# Patient Record
Sex: Male | Born: 1970 | Hispanic: No | Marital: Single | Smoking: Never smoker
Health system: Southern US, Community
[De-identification: ages and names within clinical notes are randomized; demographics above are authoritative.]

## PROBLEM LIST (undated history)

## (undated) DIAGNOSIS — J45909 Unspecified asthma, uncomplicated: Secondary | ICD-10-CM

## (undated) DIAGNOSIS — M549 Dorsalgia, unspecified: Secondary | ICD-10-CM

## (undated) HISTORY — DX: Unspecified asthma, uncomplicated: J45.909

---

## 2007-02-05 ENCOUNTER — Emergency Department (HOSPITAL_COMMUNITY): Admission: EM | Admit: 2007-02-05 | Discharge: 2007-02-05 | Payer: Self-pay | Admitting: Emergency Medicine

## 2010-02-28 ENCOUNTER — Emergency Department (HOSPITAL_COMMUNITY)
Admission: EM | Admit: 2010-02-28 | Discharge: 2010-02-28 | Payer: Self-pay | Source: Home / Self Care | Admitting: Emergency Medicine

## 2010-03-02 LAB — URINALYSIS, ROUTINE W REFLEX MICROSCOPIC
Bilirubin Urine: NEGATIVE
Hgb urine dipstick: NEGATIVE
Ketones, ur: NEGATIVE mg/dL
Nitrite: NEGATIVE
Protein, ur: NEGATIVE mg/dL
Specific Gravity, Urine: 1.023 (ref 1.005–1.030)
Urine Glucose, Fasting: NEGATIVE mg/dL
Urobilinogen, UA: 1 mg/dL (ref 0.0–1.0)
pH: 7.5 (ref 5.0–8.0)

## 2010-03-02 LAB — URINE MICROSCOPIC-ADD ON

## 2010-03-02 LAB — GC/CHLAMYDIA PROBE AMP, GENITAL
Chlamydia, DNA Probe: NEGATIVE
GC Probe Amp, Genital: NEGATIVE

## 2010-03-03 ENCOUNTER — Emergency Department (HOSPITAL_COMMUNITY)
Admission: EM | Admit: 2010-03-03 | Discharge: 2010-03-03 | Payer: Self-pay | Source: Home / Self Care | Admitting: Emergency Medicine

## 2010-03-07 LAB — URINALYSIS, ROUTINE W REFLEX MICROSCOPIC
Bilirubin Urine: NEGATIVE
Hgb urine dipstick: NEGATIVE
Ketones, ur: NEGATIVE mg/dL
Nitrite: NEGATIVE
Protein, ur: NEGATIVE mg/dL
Specific Gravity, Urine: 1.027 (ref 1.005–1.030)
Urine Glucose, Fasting: NEGATIVE mg/dL
Urobilinogen, UA: 0.2 mg/dL (ref 0.0–1.0)
pH: 6.5 (ref 5.0–8.0)

## 2011-12-30 ENCOUNTER — Encounter (HOSPITAL_COMMUNITY): Payer: Self-pay | Admitting: *Deleted

## 2011-12-30 ENCOUNTER — Emergency Department (HOSPITAL_COMMUNITY)
Admission: EM | Admit: 2011-12-30 | Discharge: 2011-12-31 | Disposition: A | Discharge status: Home / Self Care | Payer: Self-pay | Attending: Emergency Medicine | Admitting: Emergency Medicine

## 2011-12-30 DIAGNOSIS — M549 Dorsalgia, unspecified: Secondary | ICD-10-CM | POA: Insufficient documentation

## 2011-12-30 DIAGNOSIS — M62830 Muscle spasm of back: Secondary | ICD-10-CM

## 2011-12-30 DIAGNOSIS — M5380 Other specified dorsopathies, site unspecified: Secondary | ICD-10-CM | POA: Insufficient documentation

## 2011-12-30 HISTORY — DX: Dorsalgia, unspecified: M54.9

## 2011-12-30 MED ORDER — KETOROLAC TROMETHAMINE 30 MG/ML IJ SOLN
30.0000 mg | Freq: Once | INTRAMUSCULAR | Status: AC
  Administered 2011-12-30: 30 mg via INTRAMUSCULAR
  Filled 2011-12-30: qty 1

## 2011-12-30 NOTE — ED Provider Notes (Signed)
History     CSN: [id number]  Arrival date & time 12/30/11  2252   First MD Initiated Contact with Patient 12/30/11 2324      Chief Complaint  Patient presents with  . Neck Pain  . Back Pain    (Consider location/radiation/quality/duration/timing/severity/associated sxs/prior treatment) HPI Comments: Patient states, that for 5 years ago.  He hurt his back lifting a heavy object.  It's been under control.  Since, then 2 nights ago.  He started having increased pain, prohibiting him from sleep, history over-the-counter Tylenol without any relief.  He, states he was helping someone lift a heavy package.  Patient is a 41 y.o. male presenting with neck pain and back pain. The history is provided by the patient.  Neck Pain  This is a recurrent problem. The current episode started 6 to 12 hours ago. The problem occurs constantly. The problem has not changed since onset.The pain is associated with lifting a heavy object. The quality of the pain is described as aching. The pain is at a severity of 4/10. The pain is moderate. The symptoms are aggravated by position.  Back Pain  Pertinent negatives include no fever.    Past Medical History  Diagnosis Date  . Back pain     History reviewed. No pertinent past surgical history.  History reviewed. No pertinent family history.  History  Substance Use Topics  . Smoking status: Never Smoker   . Smokeless tobacco: Not on file  . Alcohol Use: Yes     Comment: socially      Review of Systems  Constitutional: Negative for fever.  HENT: Positive for neck pain.   Gastrointestinal: Negative.   Genitourinary: Negative for frequency and flank pain.  Musculoskeletal: Positive for back pain. Negative for myalgias.  Skin: Negative for rash and wound.  Neurological: Negative for dizziness.    Allergies  Review of patient's allergies indicates no known allergies.  Home Medications   Current Outpatient Rx  Name  Route  Sig  Dispense   Refill  . IBUPROFEN 600 MG PO TABS   Oral   Take 1 tablet (600 mg total) by mouth every 6 (six) hours as needed for pain.   30 tablet   0     BP 125/73  Pulse 60  Temp 98.2 F (36.8 C) (Oral)  Resp 18  SpO2 98%  Physical Exam  Constitutional: He is oriented to person, place, and time. He appears well-developed and well-nourished.  HENT:  Head: Normocephalic.  Eyes: Pupils are equal, round, and reactive to light.  Neck: Normal range of motion.  Cardiovascular: Normal rate.   Pulmonary/Chest: Effort normal.  Musculoskeletal: Normal range of motion. He exhibits tenderness. He exhibits no edema.       Arms: Neurological: He is alert and oriented to person, place, and time.  Skin: Skin is warm.    ED Course  Procedures (including critical care time)  Labs Reviewed - No data to display No results found.   1. Muscle spasm of back       MDM   Muscle spasm        Arman Filter, NP 12/31/11 0002

## 2011-12-30 NOTE — ED Notes (Signed)
Pt states long time ago he was working and hurt his back and neck. Pt state that for the past 2 nights he has tried to lay down and his back and neck have been hurting worse. Pt ambulatory.

## 2011-12-30 NOTE — ED Notes (Signed)
Pt c/o neck and back pain, reports he lifted something heavy yesterday. Pt stated he had a previous back injury that he was hospitalized for but hasn't had any problems with his back recently until yesterday.

## 2011-12-31 MED ORDER — IBUPROFEN 600 MG PO TABS: 600.0000 mg | ORAL_TABLET | Freq: Four times a day (QID) | ORAL | Status: DC | PRN

## 2011-12-31 NOTE — Discharge Instructions (Signed)
Back Exercises Back exercises help treat and prevent back injuries. The goal is to increase your strength in your belly (abdominal) and back muscles. These exercises can also help with flexibility. Start these exercises when told by your doctor. HOME CARE Back exercises include: Pelvic Tilt.  Lie on your back with your knees bent. Tilt your pelvis until the lower part of your back is against the floor. Hold this position 5 to 10 sec. Repeat this exercise 5 to 10 times. Knee to Chest.  Pull 1 knee up against your chest and hold for 20 to 30 seconds. Repeat this with the other knee. This may be done with the other leg straight or bent, whichever feels better. Then, pull both knees up against your chest. Sit-Ups or Curl-Ups.  Bend your knees 90 degrees. Start with tilting your pelvis, and do a partial, slow sit-up. Only lift your upper half 30 to 45 degrees off the floor. Take at least 2 to 3 seonds for each sit-up. Do not do sit-ups with your knees out straight. If partial sit-ups are difficult, simply do the above but with only tightening your belly (abdominal) muscles and holding it as told. Hip-Lift.  Lie on your back with your knees flexed 90 degrees. Push down with your feet and shoulders as you raise your hips 2 inches off the floor. Hold for 10 seconds, repeat 5 to 10 times. Back Arches.  Lie on your stomach. Prop yourself up on bent elbows. Slowly press on your hands, causing an arch in your low back. Repeat 3 to 5 times. Shoulder-Lifts.  Lie face down with arms beside your body. Keep hips and belly pressed to floor as you slowly lift your head and shoulders off the floor. Do not overdo your exercises. Be careful in the beginning. Exercises may cause you some mild back discomfort. If the pain lasts for more than 15 minutes, stop the exercises until you see your doctor. Improvement with exercise for back problems is slow.  Document Released: 03/04/2010 Document Revised: 04/24/2011  Document Reviewed: 12/01/2010 [employer] Patient Information 2013 [employer].  

## 2011-12-31 NOTE — ED Provider Notes (Signed)
Medical screening examination/treatment/procedure(s) were performed by non-physician practitioner and as supervising physician I was immediately available for consultation/collaboration.  Taegen Lennox, MD 12/31/11 0750 

## 2012-01-26 ENCOUNTER — Encounter (HOSPITAL_COMMUNITY): Payer: Self-pay | Admitting: *Deleted

## 2012-01-26 ENCOUNTER — Emergency Department (HOSPITAL_COMMUNITY)
Admission: EM | Admit: 2012-01-26 | Discharge: 2012-01-27 | Disposition: A | Discharge status: Home / Self Care | Payer: Self-pay | Attending: Emergency Medicine | Admitting: Emergency Medicine

## 2012-01-26 DIAGNOSIS — T300 Burn of unspecified body region, unspecified degree: Secondary | ICD-10-CM

## 2012-01-26 DIAGNOSIS — R221 Localized swelling, mass and lump, neck: Secondary | ICD-10-CM | POA: Insufficient documentation

## 2012-01-26 DIAGNOSIS — R220 Localized swelling, mass and lump, head: Secondary | ICD-10-CM | POA: Insufficient documentation

## 2012-01-26 DIAGNOSIS — M545 Low back pain, unspecified: Secondary | ICD-10-CM | POA: Insufficient documentation

## 2012-01-26 DIAGNOSIS — X088XXA Exposure to other specified smoke, fire and flames, initial encounter: Secondary | ICD-10-CM | POA: Insufficient documentation

## 2012-01-26 DIAGNOSIS — G8929 Other chronic pain: Secondary | ICD-10-CM | POA: Insufficient documentation

## 2012-01-26 DIAGNOSIS — Y929 Unspecified place or not applicable: Secondary | ICD-10-CM | POA: Insufficient documentation

## 2012-01-26 DIAGNOSIS — Y939 Activity, unspecified: Secondary | ICD-10-CM | POA: Insufficient documentation

## 2012-01-26 DIAGNOSIS — T2016XA Burn of first degree of forehead and cheek, initial encounter: Secondary | ICD-10-CM | POA: Insufficient documentation

## 2012-01-26 NOTE — ED Notes (Signed)
The lt face is swollen for the past hour minimal pain only

## 2012-01-27 ENCOUNTER — Emergency Department (HOSPITAL_COMMUNITY): Payer: Self-pay

## 2012-01-27 MED ORDER — HYDROCODONE-ACETAMINOPHEN 5-325 MG PO TABS: 1.0000 | ORAL_TABLET | ORAL | Status: DC | PRN

## 2012-01-27 MED ORDER — IBUPROFEN 800 MG PO TABS: 800.0000 mg | ORAL_TABLET | Freq: Three times a day (TID) | ORAL | Status: DC
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
## 2012-01-27 NOTE — ED Provider Notes (Signed)
Medical screening examination/treatment/procedure(s) were performed by non-physician practitioner and as supervising physician I was immediately available for consultation/collaboration.    Thersa Mohiuddin D Autrey Human, MD 01/27/12 1633 

## 2012-02-10 ENCOUNTER — Encounter (HOSPITAL_COMMUNITY): Payer: Self-pay | Admitting: *Deleted

## 2012-02-10 ENCOUNTER — Emergency Department (HOSPITAL_COMMUNITY)
Admission: EM | Admit: 2012-02-10 | Discharge: 2012-02-10 | Disposition: A | Discharge status: Home / Self Care | Payer: Self-pay | Attending: Emergency Medicine | Admitting: Emergency Medicine

## 2012-02-10 DIAGNOSIS — Z760 Encounter for issue of repeat prescription: Secondary | ICD-10-CM | POA: Insufficient documentation

## 2012-02-10 DIAGNOSIS — M549 Dorsalgia, unspecified: Secondary | ICD-10-CM | POA: Insufficient documentation

## 2012-02-10 DIAGNOSIS — Z791 Long term (current) use of non-steroidal anti-inflammatories (NSAID): Secondary | ICD-10-CM | POA: Insufficient documentation

## 2012-02-10 DIAGNOSIS — G8929 Other chronic pain: Secondary | ICD-10-CM | POA: Insufficient documentation

## 2012-02-10 MED ORDER — HYDROCODONE-ACETAMINOPHEN 5-325 MG PO TABS
1.0000 | ORAL_TABLET | Freq: Once | ORAL | Status: AC
  Administered 2012-02-10: 1 via ORAL
  Filled 2012-02-10: qty 1

## 2012-02-10 MED ORDER — HYDROCODONE-ACETAMINOPHEN 5-325 MG PO TABS: 1.0000 | ORAL_TABLET | Freq: Four times a day (QID) | ORAL | Status: DC | PRN

## 2012-02-10 MED ORDER — IBUPROFEN 800 MG PO TABS: 800.0000 mg | ORAL_TABLET | Freq: Four times a day (QID) | ORAL | Status: DC | PRN

## 2012-02-10 MED ORDER — IBUPROFEN 400 MG PO TABS
800.0000 mg | ORAL_TABLET | Freq: Once | ORAL | Status: AC
  Administered 2012-02-10: 800 mg via ORAL
  Filled 2012-02-10: qty 2

## 2012-02-10 NOTE — ED Notes (Signed)
Pt denies any questions or pain upon discharge. 

## 2012-02-10 NOTE — ED Notes (Addendum)
Pt states he injured his back 6 years ago and has had intermittent mid back pain since.  Pt states current episode started two months ago and is typically worse in the winter.  Pt states he is unable to sleep at night due to discomfort.

## 2012-02-10 NOTE — ED Provider Notes (Signed)
History     CSN: [id number]  Arrival date & time 02/10/12  2024   First MD Initiated Contact with Patient 02/10/12 2236      Chief Complaint  Patient presents with  . Back Pain    (Consider location/radiation/quality/duration/timing/severity/associated sxs/prior treatment) HPI Comments: Here for a refill of his pain medication He has not made any attempt to establish with a PCP denies any new injury or worsening symptoms  "just out of my medicine"   Patient is a 41 y.o. male presenting with back pain. The history is provided by the patient.  Back Pain  This is a chronic problem. The problem occurs constantly. Pertinent negatives include no chest pain, no numbness, no dysuria and no weakness.    Past Medical History  Diagnosis Date  . Back pain     No past surgical history on file.  History reviewed. No pertinent family history.  History  Substance Use Topics  . Smoking status: Never Smoker   . Smokeless tobacco: Not on file  . Alcohol Use: Yes     Comment: socially      Review of Systems  Constitutional: Negative for activity change.  HENT: Negative.   Eyes: Negative.   Respiratory: Negative for shortness of breath.   Cardiovascular: Negative for chest pain.  Gastrointestinal: Negative for nausea and vomiting.  Genitourinary: Negative for dysuria, frequency and flank pain.  Musculoskeletal: Positive for back pain. Negative for gait problem.  Skin: Negative for rash and wound.  Neurological: Negative for weakness and numbness.    Allergies  Review of patient's allergies indicates no known allergies.  Home Medications   Current Outpatient Rx  Name  Route  Sig  Dispense  Refill  . HYDROCODONE-ACETAMINOPHEN 5-325 MG PO TABS   Oral   Take 1 tablet by mouth every 4 (four) hours as needed for pain.   15 tablet   0   . IBUPROFEN 800 MG PO TABS   Oral   Take 1 tablet (800 mg total) by mouth 3 (three) times daily.   12 tablet   0   .  HYDROCODONE-ACETAMINOPHEN 5-325 MG PO TABS   Oral   Take 1 tablet by mouth every 6 (six) hours as needed for pain.   20 tablet   0   . IBUPROFEN 800 MG PO TABS   Oral   Take 1 tablet (800 mg total) by mouth every 6 (six) hours as needed for pain.   30 tablet   0     BP 113/67  Pulse 82  Temp 97.6 F (36.4 C) (Oral)  Resp 18  SpO2 95%  Physical Exam  Constitutional: He is oriented to person, place, and time. He appears well-developed and well-nourished.  HENT:  Head: Normocephalic.  Eyes: Pupils are equal, round, and reactive to light.  Neck: Normal range of motion.  Cardiovascular: Normal rate.   Pulmonary/Chest: Effort normal.  Abdominal: Soft.  Musculoskeletal: Normal range of motion. He exhibits no tenderness.  Neurological: He is alert and oriented to person, place, and time.  Skin: Skin is warm.    ED Course  Procedures (including critical care time)  Labs Reviewed - No data to display No results found.   1. Chronic back pain greater than 3 months duration   2. Medication refill       MDM  Medication refill       Arman Filter, NP 02/10/12 2326

## 2012-02-17 ENCOUNTER — Ambulatory Visit: Payer: Self-pay | Admitting: Emergency Medicine

## 2012-02-17 ENCOUNTER — Ambulatory Visit: Payer: Self-pay

## 2012-02-17 VITALS — BP 132/71 | HR 81 | Temp 98.1°F | Resp 16 | Ht 67.0 in | Wt 160.0 lb

## 2012-02-17 DIAGNOSIS — N200 Calculus of kidney: Secondary | ICD-10-CM

## 2012-02-17 DIAGNOSIS — M549 Dorsalgia, unspecified: Secondary | ICD-10-CM

## 2012-02-17 MED ORDER — HYDROCODONE-ACETAMINOPHEN 5-325 MG PO TABS: 1.0000 | ORAL_TABLET | Freq: Four times a day (QID) | ORAL | Status: DC | PRN

## 2012-02-17 MED ORDER — MELOXICAM 7.5 MG PO TABS: ORAL_TABLET | ORAL | Status: DC

## 2012-02-17 NOTE — Progress Notes (Signed)
  Subjective:    Patient ID: Douglas Day, male    DOB: 1970/12/27, 42 y.o.   MRN: [id number]  HPI Pt complains of back pain. He has been having it for several months. He was seen at an urgent care for this. 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 He had xrays at [facility] for this he had x-rays of his lumbar spine. The bones appear normal however there were 2 calcific densities on the left. What appears to be in the lower pole of left kidney the other may be in the left mid ureter appear his main complaint today is in pain of his lower cervical and upper thoracic area. He is requesting pain medications to help.  Review of Systems     Objective:   Physical Exam there is tenderness over C7-T3. The tendon reflexes are 2+ and symmetrical motor strength is 5 out of 5. He has good range of motion of the upper extremities. His lungs are clear.  [facility] reading (PRIMARY) by  Dr. Cleta Alberts C-spine films are normal         Assessment & Plan:  Patient has symptoms of muscle spasm in his upper cervical spine. He will be on Mobic one a day hydrocodone at night as needed for pain. There is calcification noted on his LS-spine films he had done at the hospital that are suspicious for kidney stones. He was referred to a urologist for this.

## 2012-02-23 ENCOUNTER — Ambulatory Visit: Payer: Self-pay

## 2012-02-23 ENCOUNTER — Telehealth: Payer: Self-pay

## 2012-02-23 DIAGNOSIS — M549 Dorsalgia, unspecified: Secondary | ICD-10-CM

## 2012-02-23 NOTE — Telephone Encounter (Signed)
Spoke to patient, he is out of both meds, please advise on refills.

## 2012-02-23 NOTE — Telephone Encounter (Signed)
REQUESTING REFILLS HYDROcodone-acetaminophen (NORCO/VICODIN) 5-325 MG per tablet [address]  [phone]

## 2012-02-23 NOTE — Telephone Encounter (Signed)
PT OF [facility].  WANTS TO KNOW IF WE CAN CALL IN A REFILL ON HIS HYDROCODONE AND MOBIC FOR HIS BACK PAIN.  [phone]

## 2012-02-24 MED ORDER — HYDROCODONE-ACETAMINOPHEN 5-325 MG PO TABS: 1.0000 | ORAL_TABLET | Freq: Four times a day (QID) | ORAL | Status: DC | PRN

## 2012-02-24 NOTE — Telephone Encounter (Signed)
Rx printed.  Please fax to pharmacy.

## 2012-02-24 NOTE — Telephone Encounter (Signed)
Pts wife if calling again, pt is requesting a refill for hydrocodone Please call to advise pt states 4th call

## 2012-02-24 NOTE — Telephone Encounter (Signed)
PT'S WIFE STATES THAT PT UNABLE TO WORK DUE TO THE KIDNEY STONES, PT IS EXPERIENCING LOWER BACK PAIN, AND FATIGUE AT THIS TIME. PT'S WIFE REALLY WANTS SOMEONE TO BE ABLE TO HELP PT SO HE CAN RETURN TO WORK.  BEST# [phone]

## 2012-02-29 NOTE — ED Provider Notes (Signed)
Medical screening examination/treatment/procedure(s) were performed by non-physician practitioner and as supervising physician I was immediately available for consultation/collaboration.   Alek Borges, MD 02/29/12 0828 

## 2012-03-02 ENCOUNTER — Other Ambulatory Visit: Payer: Self-pay | Admitting: Physician Assistant

## 2012-03-02 ENCOUNTER — Telehealth: Payer: Self-pay

## 2012-03-02 NOTE — Telephone Encounter (Signed)
Refill done.  

## 2012-03-02 NOTE — Telephone Encounter (Signed)
Spoke with pt advised Rx sent to pharmacy. 

## 2012-03-02 NOTE — Telephone Encounter (Signed)
Pt  Has called several times today asking about medicaiton questions  Best number [phone]

## 2012-03-09 ENCOUNTER — Other Ambulatory Visit: Payer: Self-pay | Admitting: Physician Assistant

## 2012-03-09 NOTE — Telephone Encounter (Signed)
Patient has called a couple times asking about refill on his hydrocodone for his kidney stones. He said he would call back in 4 minutes. I did not see any messages for him since 03/02/12 but says he has called up here several times and the pharmacy and wants a refill on his medication. I seen where we we supposed to set up ref to urology so I asked him if he has appt set up with or already seen urologist and he said he was waiting for his insurance. Looked through his records here and called [facility] [address] and spoke with [place]. He was given # 20 by Dr. Hyacinth Meeker from ED 02/10/12, 02/17/12 Dr. Cleta Alberts gave # 20, 02/24/12 Chelle Jeffery refilled #20, 03/02/12 Eula Listen refilled #20. Spoke with Benny Lennert PA-C and she said he will have to come in for follow up or see urologist until we can refill his hydrocodone. Patient notified and questioning decision but asked what time we open and told him 8am. He said he would come in at 8am.

## 2012-03-10 ENCOUNTER — Emergency Department (HOSPITAL_COMMUNITY)
Admission: EM | Admit: 2012-03-10 | Discharge: 2012-03-10 | Disposition: A | Discharge status: Home / Self Care | Payer: Self-pay | Attending: Emergency Medicine | Admitting: Emergency Medicine

## 2012-03-10 ENCOUNTER — Emergency Department (HOSPITAL_COMMUNITY): Payer: Self-pay

## 2012-03-10 ENCOUNTER — Encounter (HOSPITAL_COMMUNITY): Payer: Self-pay | Admitting: Cardiology

## 2012-03-10 ENCOUNTER — Ambulatory Visit: Payer: Self-pay

## 2012-03-10 DIAGNOSIS — M549 Dorsalgia, unspecified: Secondary | ICD-10-CM | POA: Insufficient documentation

## 2012-03-10 DIAGNOSIS — N200 Calculus of kidney: Secondary | ICD-10-CM | POA: Insufficient documentation

## 2012-03-10 DIAGNOSIS — R42 Dizziness and giddiness: Secondary | ICD-10-CM | POA: Insufficient documentation

## 2012-03-10 LAB — URINALYSIS, ROUTINE W REFLEX MICROSCOPIC
Bilirubin Urine: NEGATIVE
Glucose, UA: NEGATIVE mg/dL
Hgb urine dipstick: NEGATIVE
Ketones, ur: NEGATIVE mg/dL
Leukocytes, UA: NEGATIVE
Nitrite: NEGATIVE
Protein, ur: NEGATIVE mg/dL
Specific Gravity, Urine: 1.023 (ref 1.005–1.030)
Urobilinogen, UA: 0.2 mg/dL (ref 0.0–1.0)
pH: 7 (ref 5.0–8.0)

## 2012-03-10 MED ORDER — HYDROCODONE-ACETAMINOPHEN 5-325 MG PO TABS: 1.0000 | ORAL_TABLET | ORAL | Status: DC | PRN

## 2012-03-10 MED ORDER — IBUPROFEN 400 MG PO TABS
600.0000 mg | ORAL_TABLET | Freq: Once | ORAL | Status: AC
  Administered 2012-03-10: 600 mg via ORAL
  Filled 2012-03-10: qty 2

## 2012-03-10 NOTE — ED Notes (Signed)
Received bedside report from [place], [place].  Patient currently resting quietly in bed; no respiratory or acute distress noted.  Patient states that he is ready to go home; patient informed that we are waiting on disposition from EDP.  Patient denies any other needs at this time; will continue to monitor.

## 2012-03-10 NOTE — ED Notes (Signed)
Pt reports upper back pain for the past 3 months. Denies any heavy lifting or injury to the area. States he was dx with a kidney stone 3 weeks ago, and to follow up with Urologist but is waiting for his insurance to start. Denies any n/v or urinary problems.

## 2012-03-12 NOTE — ED Provider Notes (Signed)
History     CSN: [id number]  Arrival date & time 03/10/12  1518   First MD Initiated Contact with Patient 03/10/12 1606      Chief Complaint  Patient presents with  . Back Pain    (Consider location/radiation/quality/duration/timing/severity/associated sxs/prior treatment) HPI Comments: Pt comes in with cc of back pain. Pt has hx of back pain and states he has renal stones. He ran out of his norco, and comes in requesting pain meds. He has not seen Urologist, as his insurance status is in process. The pain is intermittent, sharp, and in the right flank area. No n/v/f/c. No UTI like sx. No hx of stones that required surgical care.  Patient is a 42 y.o. male presenting with back pain. The history is provided by the patient.  Back Pain  Pertinent negatives include no chest pain, no fever, no headaches and no dysuria.    Past Medical History  Diagnosis Date  . Back pain     History reviewed. No pertinent past surgical history.  History reviewed. No pertinent family history.  History  Substance Use Topics  . Smoking status: Never Smoker   . Smokeless tobacco: Not on file  . Alcohol Use: Yes     Comment: socially      Review of Systems  Constitutional: Negative for fever, chills and activity change.  HENT: Negative for neck pain.   Eyes: Negative for visual disturbance.  Respiratory: Negative for cough, chest tightness and shortness of breath.   Cardiovascular: Negative for chest pain.  Gastrointestinal: Negative for abdominal distention.  Genitourinary: Negative for dysuria, enuresis and difficulty urinating.  Musculoskeletal: Positive for back pain. Negative for arthralgias.  Neurological: Positive for dizziness. Negative for light-headedness and headaches.  Psychiatric/Behavioral: Negative for confusion.    Allergies  Review of patient's allergies indicates no known allergies.  Home Medications   Current Outpatient Rx  Name  Route  Sig  Dispense  Refill    . HYDROCODONE-ACETAMINOPHEN 5-325 MG PO TABS   Oral   Take 1 tablet by mouth every 6 (six) hours as needed. For pain         . HYDROCODONE-ACETAMINOPHEN 5-325 MG PO TABS   Oral   Take 1 tablet by mouth every 4 (four) hours as needed for pain.   15 tablet   0     BP 119/74  Pulse 72  Temp 97.8 F (36.6 C) (Oral)  Resp 18  SpO2 99%  Physical Exam  Nursing note and vitals reviewed. Constitutional: He is oriented to person, place, and time. He appears well-developed.  HENT:  Head: Normocephalic and atraumatic.  Eyes: Conjunctivae normal and EOM are normal. Pupils are equal, round, and reactive to light.  Neck: Normal range of motion. Neck supple.  Cardiovascular: Normal rate, regular rhythm and normal heart sounds.   Pulmonary/Chest: Effort normal and breath sounds normal. No respiratory distress. He has no wheezes.  Abdominal: Soft. Bowel sounds are normal. He exhibits no distension. There is no tenderness. There is no rebound and no guarding.  Genitourinary:       No CVA tenderness  Musculoskeletal:       Pt has tenderness over the lower thoracic region No step offs, no erythema. Pt has 2+ patellar reflex bilaterally. Able to discriminate between sharp and dull. Able to ambulate  Neurological: He is alert and oriented to person, place, and time.  Skin: Skin is warm.    ED Course  Procedures (including critical care time)  Labs Reviewed  URINALYSIS, ROUTINE W REFLEX MICROSCOPIC - Abnormal; Notable for the following:    APPearance CLOUDY (*)     All other components within normal limits  LAB REPORT - SCANNED   Dg Thoracic Spine 2 View  03/10/2012  *RADIOLOGY REPORT*  Clinical Data: Chronic mid back pain, worse recently.  Temporally related to a lifting injury.  THORACIC SPINE - 2 VIEW  Comparison: No prior thoracic spine imaging.  Lumbar spine x-rays 01/27/2012.  Findings: 12 rib-bearing thoracic vertebrae with anatomic alignment.  L1 has small, rudimentary ribs,  indicating that there are six non-rib bearing lumbar vertebrae.  No fractures involving the thoracic spine.  No significant spondylosis.  Pedicles intact. Very slight upper thoracic scoliosis convex right may be positional.  IMPRESSION: No acute or significant abnormalities.  Please note that there are 12 rib-bearing thoracic vertebrae indicating that L1 has small, rudimentary ribs.  This also means that there are six non-rib bearing lumbar vertebrae.  Please adjust the numbering scheme of the prior lumbar spine examinations accordingly.   Original Report Authenticated By: Hulan Saas, M.D.      1. Back pain   2. Renal stones       MDM  Pt comes in with cc of flank and back pain. Xray shows that he had an AAS that had showed stones. His pain is intermittent, moderately severe. Thoracic spine tenderness also appreciated -and will xray to ensure there is no lytic lesions as it is quite focal. If workup negative -pt ready for discharge.  Derwood Kaplan, MD 03/12/12 0010

## 2012-03-15 ENCOUNTER — Ambulatory Visit: Payer: No Typology Code available for payment source

## 2012-03-16 ENCOUNTER — Ambulatory Visit (INDEPENDENT_AMBULATORY_CARE_PROVIDER_SITE_OTHER): Payer: No Typology Code available for payment source | Admitting: Emergency Medicine

## 2012-03-16 VITALS — BP 127/84 | HR 91 | Temp 98.0°F | Resp 17 | Ht 66.5 in | Wt 160.0 lb

## 2012-03-16 DIAGNOSIS — M549 Dorsalgia, unspecified: Secondary | ICD-10-CM | POA: Insufficient documentation

## 2012-03-16 DIAGNOSIS — N200 Calculus of kidney: Secondary | ICD-10-CM

## 2012-03-16 LAB — POCT URINALYSIS DIPSTICK
Bilirubin, UA: NEGATIVE
Blood, UA: NEGATIVE
Glucose, UA: NEGATIVE
Ketones, UA: NEGATIVE
Leukocytes, UA: NEGATIVE
Nitrite, UA: NEGATIVE
Protein, UA: 30
Spec Grav, UA: >=1.03
Urobilinogen, UA: 0.2
pH, UA: 5.5

## 2012-03-16 MED ORDER — HYDROCODONE-ACETAMINOPHEN 5-325 MG PO TABS: 1.0000 | ORAL_TABLET | ORAL | Status: DC | PRN

## 2012-03-16 MED ORDER — MELOXICAM 7.5 MG PO TABS: 7.5000 mg | ORAL_TABLET | Freq: Every day | ORAL | Status: DC

## 2012-03-16 NOTE — Progress Notes (Signed)
  Subjective:    Patient ID: Douglas Day, male    DOB: Apr 11, 1970, 42 y.o.   MRN: [id number]  HPI  42 year old male presents with low back pain. Has a history of chronic back pain.  Out of the medicine that he received in the ER and was told to be seen if pain is not better once medicine was gone.  Also told that he had a kidney stone but did not do a scan.  Had xrays of his back.  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      Review of Systems     Objective:   Physical Exam there is tenderness in the right periscapular down the right side of his back. The abdomen is soft. Straight leg raising is negative. Motor strength is 5 out of 5 all muscle groups sensory intact.  Results for orders placed in visit on 03/16/12  POCT URINALYSIS DIPSTICK      Component Value Range   Color, UA dk yellow     Clarity, UA clear     Glucose, UA neg     Bilirubin, UA neg     Ketones, UA neg     Spec Grav, UA >=1.030     Blood, UA neg     pH, UA 5.5     Protein, UA 30     Urobilinogen, UA 0.2     Nitrite, UA neg     Leukocytes, UA Negative          Assessment & Plan:  Pain medicines were prescribed with 1 refill. Since he has his insurance now if this does not resolve the issue he should go to physical therapy. The question was raised about kidney stones on a previous ER visit and he did not see the urologist due to financial issues. We'll go ahead and repeat a referral to urology so we can push this issue to rest.

## 2012-03-16 NOTE — Patient Instructions (Signed)
Thoracic Strain  You have injured the muscles or tendons that attach to the upper part of your back behind your chest. This injury is called a thoracic strain, thoracic sprain, or mid-back strain.   CAUSES   The cause of thoracic strain varies. A less severe injury involves pulling a muscle or tendon without tearing it. A more severe injury involves tearing (rupturing) a muscle or tendon. With less severe injuries, there may be little loss of strength. Sometimes, there are breaks (fractures) in the bones to which the muscles are attached. These fractures are rare, unless there was a direct hit (trauma) or you have weak bones due to osteoporosis or age. Longstanding strains may be caused by overuse or improper form during certain movements. Obesity can also increase your risk for back injuries. Sudden strains may occur due to injury or not warming up properly before exercise. Often, there is no obvious cause for a thoracic strain.  SYMPTOMS   The main symptom is pain, especially with movement, such as during exercise.  DIAGNOSIS   Your caregiver can usually tell what is wrong by taking an X-ray and doing a physical exam.  TREATMENT    Physical therapy may be helpful for recovery. Your caregiver can give you exercises to do or refer you to a physical therapist after your pain improves.   After your pain improves, strengthening and conditioning programs appropriate for your sport or occupation may be helpful.   Always warm up before physical activities or athletics. Stretching after physical activity may also help.   Certain over-the-counter medicines may also help. Ask your caregiver if there are medicines that would help you.  If this is your first thoracic strain injury, proper care and proper healing time before starting activities should prevent long-term problems. Torn ligaments and tendons require as long to heal as broken bones. Average healing times may be only 1 week for a mild strain. For torn muscles  and tendons, healing time may be up to 6 weeks to 2 months.  HOME CARE INSTRUCTIONS    Apply ice to the injured area. Ice massages may also be used as directed.   Put ice in a plastic bag.   Place a towel between your skin and the bag.   Leave the ice on for 15 to 20 minutes, 3 to 4 times a day, for the first 2 days.   Only take over-the-counter or prescription medicines for pain, discomfort, or fever as directed by your caregiver.   Keep your appointments for physical therapy if this was prescribed.   Use wraps and back braces as instructed.  SEEK IMMEDIATE MEDICAL CARE IF:    You have an increase in bruising, swelling, or pain.   Your pain has not improved with medicines.   You develop new shortness of breath, chest pain, or fever.   Problems seem to be getting worse rather than better.  MAKE SURE YOU:    Understand these instructions.   Will watch your condition.   Will get help right away if you are not doing well or get worse.  Document Released: 04/22/2003 Document Revised: 04/24/2011 Document Reviewed: 03/18/2010  [employer] Patient Information 2013 [employer].

## 2012-04-01 ENCOUNTER — Other Ambulatory Visit: Payer: Self-pay | Admitting: Emergency Medicine

## 2012-04-01 NOTE — Telephone Encounter (Signed)
PT NEEDS REFILL ON RX

## 2012-04-02 ENCOUNTER — Ambulatory Visit (INDEPENDENT_AMBULATORY_CARE_PROVIDER_SITE_OTHER): Payer: No Typology Code available for payment source | Admitting: Family Medicine

## 2012-04-02 VITALS — BP 117/70 | HR 65 | Temp 97.9°F | Resp 16 | Ht 67.0 in | Wt 163.0 lb

## 2012-04-02 DIAGNOSIS — M545 Low back pain, unspecified: Secondary | ICD-10-CM

## 2012-04-02 DIAGNOSIS — T148XXA Other injury of unspecified body region, initial encounter: Secondary | ICD-10-CM

## 2012-04-02 MED ORDER — METHOCARBAMOL 500 MG PO TABS: 500.0000 mg | ORAL_TABLET | Freq: Every evening | ORAL | Status: DC | PRN

## 2012-04-02 MED ORDER — DICLOFENAC SODIUM 75 MG PO TBEC: 75.0000 mg | DELAYED_RELEASE_TABLET | Freq: Two times a day (BID) | ORAL | Status: DC | PRN

## 2012-04-02 MED ORDER — HYDROCODONE-ACETAMINOPHEN 5-325 MG PO TABS: 1.0000 | ORAL_TABLET | Freq: Three times a day (TID) | ORAL | Status: DC | PRN

## 2012-04-02 NOTE — Progress Notes (Addendum)
[facility]:  Office Visit  Chief Complaint:  Chief Complaint  Patient presents with  . Medication Refill    hydrocodone    HPI: Douglas Day is a 42 y.o. male who complains of chronic back pain issues and wants a refill of his Lortab. He is currently not having back pain. He had back pain last night. He has more back pain when he sleeps too much, when he sits too long, when he walks too much. 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 He has been on this medicine for several months. Denies any weakness, numbness or tingling, incontinence. He has tried rx strength ibuprofen without relief. He take 20 pills of Lortab in 1 week. He states it is the only thing that works for him.  He stopped taking mobic because it did not help. He has been to multiple ERs for this recurrent back problem. " I need those pills, I am late for work, I need to work to pay the bills" he states.  He has back pain from a back pull 5-6 years ago while working at [employer], it radiates from thoracic spine, sometimes down to lumbar. It is in different places. He states he will go to another place to get his medication if we will not be prescribing his medicine. He denies being addicted to this, he understands that it is a narcotic and there are other medications for back pain but he still "wants this one" .   Past Medical History  Diagnosis Date  . Back pain    No past surgical history on file. History   Social History  . Marital Status: Single    Spouse Name: N/A    Number of Children: N/A  . Years of Education: N/A   Social History Main Topics  . Smoking status: Never Smoker   . Smokeless tobacco: Not on file  . Alcohol Use: Yes     Comment: socially  . Drug Use: No  . Sexually Active: Yes   Other Topics Concern  . Not on file   Social History Narrative  . No narrative on file   No family history on file. No Known Allergies Prior to  Admission medications   Medication Sig Start Date End Date Taking? Authorizing Provider  HYDROcodone-acetaminophen (NORCO/VICODIN) 5-325 MG per tablet Take 1 tablet by mouth every 4 (four) hours as needed for pain. Must return to clinic if not better 04/01/12  Yes Ryan M Dunn, PA-C  meloxicam (MOBIC) 7.5 MG tablet Take 1 tablet (7.5 mg total) by mouth daily. 03/16/12   Collene Gobble, MD     ROS: The patient denies fevers, chills, night sweats, unintentional weight loss, chest pain, palpitations, wheezing, dyspnea on exertion, nausea, vomiting, abdominal pain, dysuria, hematuria, melena, numbness, weakness, or tingling.   All other systems have been reviewed and were otherwise negative with the exception of those mentioned in the HPI and as above.    PHYSICAL EXAM: Filed Vitals:   04/02/12 1539  BP: 117/70  Pulse: 65  Temp: 97.9 F (36.6 C)  Resp: 16   Filed Vitals:   04/02/12 1539  Height: 5\' 7"  (1.702 m)  Weight: 163 lb (73.936 kg)   Body mass index is 25.52 kg/(m^2).  General: Alert, no acute distress HEENT:  Normocephalic, atraumatic, oropharynx patent.  Cardiovascular:  Regular rate and rhythm, no rubs murmurs or gallops.  No pedal edema.  Respiratory: Clear to auscultation bilaterally.  No wheezes, rales, or rhonchi.  No cyanosis, no use of accessory musculature GI: No organomegaly, abdomen is soft and non-tender, positive bowel sounds.  No masses. Skin: No rashes. Neurologic: Facial musculature symmetric. Psychiatric: Patient is appropriate throughout our interaction. Lymphatic: No cervical lymphadenopathy Musculoskeletal: Gait intact. Low back-nontender, full ROM, 5/5 strength, 2/2 DTRs.Sensation intact.Neg straight leg bilaterally. No saddle anesthesia   LABS: Results for orders placed in visit on 03/16/12  POCT URINALYSIS DIPSTICK      Result Value Range   Color, UA dk yellow     Clarity, UA clear     Glucose, UA neg     Bilirubin, UA neg     Ketones, UA neg      Spec Grav, UA >=1.030     Blood, UA neg     pH, UA 5.5     Protein, UA 30     Urobilinogen, UA 0.2     Nitrite, UA neg     Leukocytes, UA Negative       EKG/XRAY:   Primary read interpreted by Dr. Conley Rolls at [facility].   ASSESSMENT/PLAN: Encounter Diagnoses  Name Primary?  . Chronic low back pain Yes  . Sprain and strain    Narcotic profile pulled-he has been getting Vicodin 5/325 mg on 12/14, 12/28,1/4, 1/11, 1/18, 1/26, 2/1, 2/10 for 15-20 pills each time.  He was agitated when I recommended other meds, he states he has "tried them all" and only those pills work. Reviews of xrays for cervical, thoracic, and also lumbar spine done < 3 months ago at different facilities were negative for  Any acute fractures/dislocation. There was an incidental finding of left kidney stone.  Rx Vicodin #20 with no refills, refer to PT and also chronic pain for further management.  Rx Robaxen and Diclofenac, Vicodin We will no longer refill his Vicodin, will refer to chronic pain/PT. He understand this.  F/u prn     Babette Stum PHUONG, DO 04/02/2012 4:42 PM    04/08/12--PATIENT WAS REFERRED TO [facility]. HE STATES HE COULD NOT GO TO PT SINCE HE WORKS 3 JOBS. HE HAS BEEN REFERRED TO CHRONIC PAIN.

## 2012-04-02 NOTE — Telephone Encounter (Signed)
Pt is requesting refills on HYDROcodone-acetaminophen (NORCO/VICODIN) 5-325 MG per tablet meloxicam (MOBIC) 7.5 MG tablet   vbn  [phone]

## 2012-04-08 NOTE — Progress Notes (Deleted)
  Subjective:    Patient ID: Douglas Day, male    DOB: [birth year], 42 y.o.   MRN: [id number]  HPI    Review of Systems     Objective:   Physical Exam        Assessment & Plan:

## 2012-04-26 ENCOUNTER — Encounter (HOSPITAL_COMMUNITY): Payer: Self-pay | Admitting: Emergency Medicine

## 2012-04-26 ENCOUNTER — Emergency Department (INDEPENDENT_AMBULATORY_CARE_PROVIDER_SITE_OTHER)
Admission: EM | Admit: 2012-04-26 | Discharge: 2012-04-26 | Disposition: A | Discharge status: Home / Self Care | Payer: No Typology Code available for payment source | Source: Home / Self Care | Attending: Emergency Medicine | Admitting: Emergency Medicine

## 2012-04-26 DIAGNOSIS — M549 Dorsalgia, unspecified: Secondary | ICD-10-CM

## 2012-04-26 DIAGNOSIS — G8929 Other chronic pain: Secondary | ICD-10-CM

## 2012-04-26 NOTE — ED Provider Notes (Signed)
History     CSN: [id number]  Arrival date & time 04/26/12  1426   First MD Initiated Contact with Patient 04/26/12 1651      Chief Complaint  Patient presents with  . Medication Refill  . Back Pain    (Consider location/radiation/quality/duration/timing/severity/associated sxs/prior treatment) HPI Comments: Pt reports doesn't always have pain, it comes and goes. Also reviewed pt's previous medical records.  Is a pt of [facility], most recent note on pt indicates he will no longer be able to get vicodin rx there and was referred to PT and pain management.   Patient is a 42 y.o. male presenting with back pain. The history is provided by the patient.  Back Pain Location:  Thoracic spine and lumbar spine Quality:  Aching Radiates to:  Does not radiate Pain severity:  Moderate Onset quality:  Sudden (many years ago lifting at work) Timing:  Intermittent Progression:  Unchanged Chronicity:  Chronic Relieved by:  Narcotics Ineffective treatments:  NSAIDs and muscle relaxants Associated symptoms: no abdominal pain, no bladder incontinence, no bowel incontinence, no dysuria, no fever, no numbness, no paresthesias, no perianal numbness, no tingling and no weakness     Past Medical History  Diagnosis Date  . Back pain     History reviewed. No pertinent past surgical history.  History reviewed. No pertinent family history.  History  Substance Use Topics  . Smoking status: Never Smoker   . Smokeless tobacco: Not on file  . Alcohol Use: Yes     Comment: socially      Review of Systems  Constitutional: Negative for fever and chills.  Gastrointestinal: Negative for abdominal pain and bowel incontinence.  Genitourinary: Negative for bladder incontinence and dysuria.  Musculoskeletal: Positive for back pain.  Neurological: Negative for tingling, weakness, numbness and paresthesias.    Allergies  Review of patient's allergies indicates no known allergies.  Home Medications    Current Outpatient Rx  Name  Route  Sig  Dispense  Refill  . diclofenac (VOLTAREN) 75 MG EC tablet   Oral   Take 1 tablet (75 mg total) by mouth 2 (two) times daily as needed. Take with food for back pain, no other NSAIDs.   30 tablet   0   . HYDROcodone-acetaminophen (NORCO/VICODIN) 5-325 MG per tablet   Oral   Take 1 tablet by mouth every 8 (eight) hours as needed for pain. No more refills from our office. Will refer to chronic pain specialist.   20 tablet   0   . methocarbamol (ROBAXIN) 500 MG tablet   Oral   Take 1 tablet (500 mg total) by mouth at bedtime as needed. Muscles spasms. Do not drive with this.   30 tablet   0     BP 137/87  Pulse 66  Temp(Src) 97.8 F (36.6 C) (Oral)  Resp 21  SpO2 99%  Physical Exam  Constitutional: He is oriented to person, place, and time. He appears well-developed and well-nourished. No distress.  Musculoskeletal:       Thoracic back: Normal.       Lumbar back: Normal.  Neurological: He is alert and oriented to person, place, and time. Gait normal.    ED Course  Procedures (including critical care time)  Labs Reviewed - No data to display No results found.   1. Chronic back pain       MDM  Pt does not appear in pain today; moved from supine to sitting position using abd/back muscles fluidly, did not  have to use upper body to push up.  Explained to pt we do not provide chronic pain management here at [facility]. Encouraged pt to talk with his pcp about the pain mgmt and PT referrals.  Offered alternate med for tx (did not specify which)- pt refused saying he has other meds and none help his pain but the vicodin.         Cathlyn Parsons, NP 04/26/12 1704

## 2012-04-26 NOTE — ED Provider Notes (Signed)
Medical screening examination/treatment/procedure(s) were performed by non-physician practitioner and as supervising physician I was immediately available for consultation/collaboration.  Leslee Home, M.D.  Reuben Likes, MD 04/26/12 [phone]

## 2012-04-26 NOTE — ED Notes (Signed)
Upon discharge, pt upset and started to use profanity b/c the provider did not give him the medication he wanted and felt like he just wasted time coming here. Pt did not want to sign the discharge but eventually did.

## 2012-04-26 NOTE — ED Notes (Addendum)
Pt is here needing refill on his hydro/acet 5/325mg  for his chronic lower back pain due to an old inj Denies: dysuria, hematuria, f/v/n/d Goes to [facility] but did not want to go there today  He is alert and oriented w/no signs of acute distress.

## 2012-06-27 ENCOUNTER — Emergency Department (HOSPITAL_COMMUNITY)
Admission: EM | Admit: 2012-06-27 | Discharge: 2012-06-28 | Disposition: A | Discharge status: Home / Self Care | Payer: No Typology Code available for payment source | Attending: Emergency Medicine | Admitting: Emergency Medicine

## 2012-06-27 ENCOUNTER — Encounter (HOSPITAL_COMMUNITY): Payer: Self-pay | Admitting: *Deleted

## 2012-06-27 DIAGNOSIS — R52 Pain, unspecified: Secondary | ICD-10-CM | POA: Insufficient documentation

## 2012-06-27 DIAGNOSIS — M545 Low back pain, unspecified: Secondary | ICD-10-CM | POA: Insufficient documentation

## 2012-06-27 DIAGNOSIS — G8929 Other chronic pain: Secondary | ICD-10-CM | POA: Insufficient documentation

## 2012-06-27 NOTE — ED Notes (Signed)
C/o lower back pain when moving furniture. Sharp pain.

## 2012-06-28 MED ORDER — NAPROXEN 500 MG PO TABS: 500.0000 mg | ORAL_TABLET | Freq: Two times a day (BID) | ORAL | Status: DC

## 2012-06-28 MED ORDER — HYDROCODONE-ACETAMINOPHEN 5-325 MG PO TABS: 1.0000 | ORAL_TABLET | Freq: Four times a day (QID) | ORAL | Status: DC | PRN

## 2012-06-28 MED ORDER — CYCLOBENZAPRINE HCL 5 MG PO TABS: 5.0000 mg | ORAL_TABLET | Freq: Three times a day (TID) | ORAL | Status: DC | PRN

## 2012-06-28 MED ORDER — IBUPROFEN 400 MG PO TABS
400.0000 mg | ORAL_TABLET | Freq: Once | ORAL | Status: AC
  Administered 2012-06-28: 400 mg via ORAL
  Filled 2012-06-28: qty 1

## 2012-06-28 MED ORDER — OXYCODONE-ACETAMINOPHEN 5-325 MG PO TABS
1.0000 | ORAL_TABLET | Freq: Once | ORAL | Status: AC
  Administered 2012-06-28: 1 via ORAL
  Filled 2012-06-28: qty 1

## 2012-06-28 MED ORDER — DIAZEPAM 5 MG PO TABS
5.0000 mg | ORAL_TABLET | Freq: Once | ORAL | Status: AC
  Administered 2012-06-28: 5 mg via ORAL
  Filled 2012-06-28: qty 1

## 2012-06-28 NOTE — ED Provider Notes (Signed)
Medical screening examination/treatment/procedure(s) were performed by non-physician practitioner and as supervising physician I was immediately available for consultation/collaboration.  Olivia Mackie, MD 06/28/12 [phone]

## 2012-06-28 NOTE — ED Provider Notes (Signed)
History     CSN: [id number]  Arrival date & time 06/27/12  2221   First MD Initiated Contact with Patient 06/28/12 0044      Chief Complaint  Patient presents with  . Back Pain    (Consider location/radiation/quality/duration/timing/severity/associated sxs/prior treatment) Patient is a 42 y.o. male presenting with back pain. The history is provided by the patient and medical records.  Back Pain Location:  Sacro-iliac joint Quality:  Aching Radiates to:  Does not radiate Pain severity:  Moderate Pain is:  Unable to specify Onset quality:  Sudden Duration:  4 hours Timing:  Constant Progression:  Unchanged Chronicity:  Recurrent Context: lifting heavy objects   Context: not MVA, not occupational injury, not physical stress, not recent illness and not twisting   Context comment:  Pushing heavy couch Relieved by:  Nothing Worsened by:  Bending, ambulation and palpation Ineffective treatments:  None tried Associated symptoms: no abdominal pain, no abdominal swelling, no bladder incontinence, no bowel incontinence, no chest pain, no dysuria, no fever, no headaches, no leg pain, no numbness, no paresthesias, no pelvic pain, no perianal numbness, no tingling, no weakness and no weight loss   Risk factors: no hx of cancer, no hx of osteoporosis, no lack of exercise, no menopause, not obese, no recent surgery, no steroid use and no vascular disease     Past Medical History  Diagnosis Date  . Back pain     No past surgical history on file.  No family history on file.  History  Substance Use Topics  . Smoking status: Never Smoker   . Smokeless tobacco: Not on file  . Alcohol Use: Yes     Comment: socially      Review of Systems  Constitutional: Negative for fever and weight loss.  Cardiovascular: Negative for chest pain.  Gastrointestinal: Negative for abdominal pain and bowel incontinence.  Genitourinary: Negative for bladder incontinence, dysuria and pelvic pain.   Musculoskeletal: Positive for back pain.  Neurological: Negative for tingling, weakness, numbness, headaches and paresthesias.  All other systems reviewed and are negative.    Allergies  Review of patient's allergies indicates no known allergies.  Home Medications  No current outpatient prescriptions on file.  BP 109/60  Temp(Src) 98.3 F (36.8 C) (Oral)  Resp 14  SpO2 95%  Physical Exam  Nursing note and vitals reviewed. Constitutional: He is oriented to person, place, and time. He appears well-developed and well-nourished. No distress.  HENT:  Head: Normocephalic and atraumatic.  Eyes: Conjunctivae and EOM are normal. Pupils are equal, round, and reactive to light. No scleral icterus.  Neck: Normal range of motion and full passive range of motion without pain. Neck supple. No spinous process tenderness and no muscular tenderness present. No rigidity. Normal range of motion present. No Brudzinski's sign noted.  Cardiovascular: Normal rate, regular rhythm and intact distal pulses.  Exam reveals no gallop and no friction rub.   No murmur heard. Intact distal pulses, capillary refill less than 3 seconds bilaterally.   Pulmonary/Chest: Effort normal and breath sounds normal. No respiratory distress. He has no wheezes. He has no rales. He exhibits no tenderness.  Musculoskeletal:  Bilateral lower extremities nontender without color change, baseline range of motion of extremities with Pt has increased pain w ROM of lumbar spine. Pain w ambulation. Negative straight leg test bilaterally.   Neurological: He is alert and oriented to person, place, and time. He has normal strength and normal reflexes. No sensory deficit. Abnormal gait: no ataxia,  slowed and hunched d/t pain   Sensation at baseline for light touch in all 4 distal extremities, motor symmetric & bilateral 5/5 (hips: abduction, adduction, flexion; knee: flexion & extension; foot: dorsiflexion, plantar flexion, toes: dorsi  flexion)  Skin: Skin is warm and dry. No rash noted. He is not diaphoretic. No erythema. No pallor.  Psychiatric: He has a normal mood and affect.    ED Course  Procedures (including critical care time)  Labs Reviewed - No data to display No results found.   No diagnosis found.    MDM  Acute on chronic back pain triggered by moving couch   No neurological deficits and normal neuro exam.  Patient can walk but states is painful.  No loss of bowel or bladder control.  No concern for cauda equina.  No fever, night sweats, weight loss, h/o cancer, IVDU.  RICE protocol and pain medicine indicated and discussed with patient.          Jaci Carrel, [place] 06/28/12 [phone]

## 2012-06-28 NOTE — ED Notes (Signed)
Pt discharge.Vital signs stable and GCS 15. 

## 2012-07-14 ENCOUNTER — Emergency Department (HOSPITAL_COMMUNITY)
Admission: EM | Admit: 2012-07-14 | Discharge: 2012-07-14 | Disposition: A | Discharge status: Home / Self Care | Payer: No Typology Code available for payment source | Attending: Emergency Medicine | Admitting: Emergency Medicine

## 2012-07-14 ENCOUNTER — Encounter (HOSPITAL_COMMUNITY): Payer: Self-pay | Admitting: *Deleted

## 2012-07-14 DIAGNOSIS — Z79899 Other long term (current) drug therapy: Secondary | ICD-10-CM | POA: Insufficient documentation

## 2012-07-14 DIAGNOSIS — G8929 Other chronic pain: Secondary | ICD-10-CM | POA: Insufficient documentation

## 2012-07-14 DIAGNOSIS — M549 Dorsalgia, unspecified: Secondary | ICD-10-CM

## 2012-07-14 MED ORDER — HYDROCODONE-ACETAMINOPHEN 5-325 MG PO TABS: 1.0000 | ORAL_TABLET | Freq: Four times a day (QID) | ORAL | Status: DC | PRN

## 2012-07-14 MED ORDER — NAPROXEN 500 MG PO TABS: 500.0000 mg | ORAL_TABLET | Freq: Two times a day (BID) | ORAL | Status: DC

## 2012-07-14 NOTE — ED Provider Notes (Signed)
History    This chart was scribed for non-physician practitioner, Lottie Mussel, PA-C, working with Geoffery Lyons, MD by Melba Coon, ED Scribe. This patient was seen in room TR09C/TR09C and the patient's care was started at 10:25PM.  CSN: [id number]  Arrival date & time 07/14/12  2146   First MD Initiated Contact with Patient 07/14/12 2205      Chief Complaint  Patient presents with  . Back Pain    (Consider location/radiation/quality/duration/timing/severity/associated sxs/prior treatment) The history is provided by the patient. No language interpreter was used.   HPI Comments: Douglas Day is a 42 y.o. male who presents to the Emergency Department complaining of intermittent, moderate to severe right sided back pain without radiation, numbness or weakness to the legs with an onset last night. Douglas Day has a history of chronic back pain and takes naproxen, flexeril, and norco for the pain which usually alleviates his pain. However, he reports he ran out of medication. He has never seen a back specialist before. He reports that he exacerbated his pain when he was lifting heavy items and bending frequently at his job. He reports he works through the pain and does not rest. Bending, twisting, and overall physical movement aggravates his pain. No known allergies. No other pertinent medical symptoms.  Past Medical History  Diagnosis Date  . Back pain     No past surgical history on file.  No family history on file.  History  Substance Use Topics  . Smoking status: Never Smoker   . Smokeless tobacco: Not on file  . Alcohol Use: Yes     Comment: socially    Review of Systems  Constitutional: Negative for fever and diaphoresis.  HENT: Negative for neck pain and neck stiffness.   Eyes: Negative for visual disturbance.  Respiratory: Negative for apnea, chest tightness and shortness of breath.   Cardiovascular: Negative for chest pain and palpitations.   Gastrointestinal: Negative for nausea, vomiting, diarrhea and constipation.  Genitourinary: Negative for dysuria.  Musculoskeletal: Positive for back pain. Negative for gait problem.  Skin: Negative for rash.  Neurological: Negative for dizziness, weakness, light-headedness, numbness and headaches.  All other systems reviewed and are negative.    Allergies  Review of patient's allergies indicates no known allergies.  Home Medications   Current Outpatient Rx  Name  Route  Sig  Dispense  Refill  . cyclobenzaprine (FLEXERIL) 5 MG tablet   Oral   Take 1 tablet (5 mg total) by mouth 3 (three) times daily as needed for muscle spasms. Take 1-2 tabs TID as needed for muscle spasms   30 tablet   0   . HYDROcodone-acetaminophen (NORCO/VICODIN) 5-325 MG per tablet   Oral   Take 1 tablet by mouth every 6 (six) hours as needed for pain.   15 tablet   0   . naproxen (NAPROSYN) 500 MG tablet   Oral   Take 1 tablet (500 mg total) by mouth 2 (two) times daily.   30 tablet   0     BP 110/67  Pulse 67  Temp(Src) 97.7 F (36.5 C) (Oral)  Resp 16  Wt 153 lb 12.8 oz (69.763 kg)  BMI 24.08 kg/m2  SpO2 99%  Physical Exam  Nursing note and vitals reviewed. Constitutional: He is oriented to person, place, and time. He appears well-developed and well-nourished.  HENT:  Head: Normocephalic.  Eyes: EOM are normal. Pupils are equal, round, and reactive to light. Right eye exhibits no discharge. Left eye  exhibits no discharge.  Neck: Neck supple. No tracheal deviation present.  Cardiovascular: Normal rate and regular rhythm.   Pulmonary/Chest: Effort normal and breath sounds normal. No respiratory distress. He has no wheezes. He has no rales.  Abdominal: Soft. There is no tenderness.  Musculoskeletal: Normal range of motion. He exhibits tenderness. He exhibits no edema.  TTP right SI joint. SLR negative bilaterally. Strength intact in bilateral legs. NV intact. No midline spine tenderness.  Negative straight leg raise bilaterally.   Neurological: He is alert and oriented to person, place, and time. He has normal reflexes.  2+ bilateral patellar reflexes  Skin: Skin is warm. No rash noted. He is not diaphoretic. No erythema.    ED Course  Procedures (including critical care time)  COORDINATION OF CARE:  10:29PM - advised to f/u with PCP or back specialist. Given resource guides and referrals. He is ready for d/c.   Labs Reviewed - No data to display No results found.   1. Back pain       MDM  Pt with chronic back pain. No signs of cauda equina. No fever. No iv drug use. Pt with similar flares in the past. statse norco and naprosyn help with give prescription for few norco and naprosyn, explained must find a pcp or specialist to see for his back pain. Pt voiced understanding.   Filed Vitals:   07/14/12 2152 07/14/12 2200  BP: 110/67   Pulse: 67   Temp: 97.7 F (36.5 C)   TempSrc: Oral   Resp: 16   Weight:  153 lb 12.8 oz (69.763 kg)  SpO2: 99%    I personally performed the services described in this documentation, which was scribed in my presence. The recorded information has been reviewed and is accurate.        Lottie Mussel, PA-C 07/15/12 [phone]

## 2012-07-14 NOTE — ED Notes (Signed)
Pt with recurrent rt sided back pain. Has ran out of medication. Last was at job lifting and bending. Exacerbated his pain.

## 2012-07-15 NOTE — ED Provider Notes (Signed)
Medical screening examination/treatment/procedure(s) were performed by non-physician practitioner and as supervising physician I was immediately available for consultation/collaboration.  John-Adam Aubery Douthat, M.D.   John-Adam Irelynn Schermerhorn, MD 07/15/12 0209 

## 2012-08-30 ENCOUNTER — Encounter (HOSPITAL_COMMUNITY): Payer: Self-pay | Admitting: *Deleted

## 2012-08-30 ENCOUNTER — Emergency Department (HOSPITAL_COMMUNITY)
Admission: EM | Admit: 2012-08-30 | Discharge: 2012-08-30 | Disposition: A | Discharge status: Home / Self Care | Payer: No Typology Code available for payment source | Attending: Emergency Medicine | Admitting: Emergency Medicine

## 2012-08-30 DIAGNOSIS — G8929 Other chronic pain: Secondary | ICD-10-CM | POA: Insufficient documentation

## 2012-08-30 DIAGNOSIS — M549 Dorsalgia, unspecified: Secondary | ICD-10-CM

## 2012-08-30 MED ORDER — NAPROXEN 500 MG PO TABS: 500.0000 mg | ORAL_TABLET | Freq: Two times a day (BID) | ORAL | Status: DC

## 2012-08-30 NOTE — ED Provider Notes (Signed)
This chart was scribed for Douglas Day, a non-physician practitioner working with Doug Sou, MD by Lewanda Rife, ED Scribe. This patient was seen in room TR11C/TR11C and the patient's care was started at 1800.     History    CSN: [id number] Arrival date & time 08/30/12  1658  First MD Initiated Contact with Patient 08/30/12 1735     Chief Complaint  Patient presents with  . Medication Refill   (Consider location/radiation/quality/duration/timing/severity/associated sxs/prior Treatment) The history is provided by the patient.   HPI Comments: Douglas Day is a 42 y.o. male who presents to the Emergency Department complaining of constant chronic right-sided low back pain onset 1 year. Denies associated recent injury, paraesthesias, and fever. Report ambulates well without assistance. Reports low back pain is unchanged, but needs pain medication refill. Denies aggravating or alleviating symptoms. Denies taking other medications or heating pad to alleviate symptoms. Denies trying to find another doctor.     Past Medical History  Diagnosis Date  . Back pain    History reviewed. No pertinent past surgical history. History reviewed. No pertinent family history. History  Substance Use Topics  . Smoking status: Never Smoker   . Smokeless tobacco: Not on file  . Alcohol Use: Yes     Comment: socially    Review of Systems  Constitutional: Negative for fever.  Musculoskeletal: Positive for back pain.  Psychiatric/Behavioral: Negative for confusion.  All other systems reviewed and are negative.   A complete 10 system review of systems was obtained and all systems are negative except as noted in the HPI and PMH.    Allergies  Review of patient's allergies indicates no known allergies.  Home Medications  No current outpatient prescriptions on file. BP 115/74  Pulse 70  Temp(Src) 99 F (37.2 C) (Oral)  Resp 18  SpO2 98% Physical Exam  Nursing note and  vitals reviewed. Constitutional: He is oriented to person, place, and time. He appears well-developed and well-nourished. No distress.  HENT:  Head: Normocephalic and atraumatic.  Eyes: EOM are normal.  Neck: Neck supple. No tracheal deviation present.  Cardiovascular: Normal rate.   Pulmonary/Chest: Effort normal. No respiratory distress.  Musculoskeletal: Normal range of motion.  Midline lumbar spine tenderness, no perivertebral tenderness   Neurological: He is alert and oriented to person, place, and time.  5/5 and equal lower extremity strength. 2+ and equal patellar reflexes bilaterally. Pt able to dorsiflex bilateral toes and feet with good strength against resistance. Equal sensation bilaterally over thighs and lower legs.   Skin: Skin is warm and dry.  Psychiatric: He has a normal mood and affect. His behavior is normal.   ED Course  Procedures (including critical care time)  6:00 PM pt offered pain medicine in ED, but refused because he is fasting  1. Chronic back pain     MDM  Pt with chronic back pain. No new injuries. No new findings on exam. Pt asking for norco refill. Multiple prior visits for the same. Will not prescribe narcotics at this time. Will d/c home with follow up with pcp.   Filed Vitals:   08/30/12 1714  BP: 115/74  Pulse: 70  Temp: 99 F (37.2 C)  TempSrc: Oral  Resp: 18  SpO2: 98%   I personally performed the services described in this documentation, which was scribed in my presence. The recorded information has been reviewed and is accurate.   Lottie Mussel, PA-C 08/30/12 2353

## 2012-08-30 NOTE — ED Notes (Signed)
Reports right side lower back pain, hx of same but is out of his meds. Ambulatory at triage.

## 2012-08-31 NOTE — ED Provider Notes (Signed)
Medical screening examination/treatment/procedure(s) were performed by non-physician practitioner and as supervising physician I was immediately available for consultation/collaboration.  Doug Sou, MD 08/31/12 [phone]

## 2012-12-22 ENCOUNTER — Emergency Department (HOSPITAL_COMMUNITY)
Admission: EM | Admit: 2012-12-22 | Discharge: 2012-12-23 | Disposition: A | Discharge status: Home / Self Care | Payer: No Typology Code available for payment source | Attending: Emergency Medicine | Admitting: Emergency Medicine

## 2012-12-22 ENCOUNTER — Encounter (HOSPITAL_COMMUNITY): Payer: Self-pay | Admitting: Emergency Medicine

## 2012-12-22 DIAGNOSIS — M549 Dorsalgia, unspecified: Secondary | ICD-10-CM | POA: Insufficient documentation

## 2012-12-22 DIAGNOSIS — M25519 Pain in unspecified shoulder: Secondary | ICD-10-CM | POA: Insufficient documentation

## 2012-12-22 DIAGNOSIS — Z79899 Other long term (current) drug therapy: Secondary | ICD-10-CM | POA: Insufficient documentation

## 2012-12-22 DIAGNOSIS — G8929 Other chronic pain: Secondary | ICD-10-CM | POA: Insufficient documentation

## 2012-12-22 DIAGNOSIS — M25579 Pain in unspecified ankle and joints of unspecified foot: Secondary | ICD-10-CM | POA: Insufficient documentation

## 2012-12-22 DIAGNOSIS — M79672 Pain in left foot: Secondary | ICD-10-CM

## 2012-12-22 DIAGNOSIS — R51 Headache: Secondary | ICD-10-CM | POA: Insufficient documentation

## 2012-12-22 NOTE — ED Notes (Signed)
Pt. 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

## 2012-12-23 MED ORDER — NAPROXEN 500 MG PO TABS: 500.0000 mg | ORAL_TABLET | Freq: Two times a day (BID) | ORAL | Status: DC

## 2012-12-23 MED ORDER — NAPROXEN 250 MG PO TABS
500.0000 mg | ORAL_TABLET | Freq: Once | ORAL | Status: AC
  Administered 2012-12-23: 500 mg via ORAL
  Filled 2012-12-23: qty 2

## 2012-12-23 NOTE — ED Provider Notes (Signed)
Medical screening examination/treatment/procedure(s) were performed by non-physician practitioner and as supervising physician I was immediately available for consultation/collaboration.    Vida Roller, MD 12/23/12 [phone]

## 2012-12-23 NOTE — ED Provider Notes (Signed)
CSN: [id number]     Arrival date & time 12/22/12  2320 History   First MD Initiated Contact with Patient 12/22/12 2354     Chief Complaint  Patient presents with  . Foot Pain  . Back Pain   (Consider location/radiation/quality/duration/timing/severity/associated sxs/prior Treatment) HPI Comments: Patient with a history of chronic back pain out of his Naprosyn.  States, that he slept in a funny position, and his head, left shoulder and neck pain.  All day.  Not taking any medication.  Prior to arrival.  Symptoms or not any different than his normal chronic back pain He also complains that the bottom of his left foot, lateral aspect, mid foot, has a sharp pain today.  Denies stepping on any foreign bodies, hyperextending his foot, or trauma, in any way  Patient is a 42 y.o. male presenting with lower extremity pain and back pain. The history is provided by the patient.  Foot Pain This is a chronic problem. The problem occurs constantly. Associated symptoms include neck pain. Pertinent negatives include no fever, headaches, joint swelling, numbness or weakness. The symptoms are aggravated by exertion. He has tried nothing for the symptoms. The treatment provided no relief.  Back Pain Associated symptoms: no fever, no headaches, no numbness and no weakness     Past Medical History  Diagnosis Date  . Back pain    History reviewed. No pertinent past surgical history. No family history on file. History  Substance Use Topics  . Smoking status: Never Smoker   . Smokeless tobacco: Not on file  . Alcohol Use: Yes     Comment: socially    Review of Systems  Constitutional: Negative for fever.  HENT: Negative for ear pain.   Musculoskeletal: Positive for back pain and neck pain. Negative for joint swelling.  Skin: Negative for wound.  Neurological: Negative for dizziness, weakness, numbness and headaches.  All other systems reviewed and are negative.    Allergies  Review of patient's  allergies indicates no known allergies.  Home Medications   Current Outpatient Rx  Name  Route  Sig  Dispense  Refill  . naproxen (NAPROSYN) 500 MG tablet   Oral   Take 1 tablet (500 mg total) by mouth 2 (two) times daily.   30 tablet   0   . naproxen (NAPROSYN) 500 MG tablet   Oral   Take 1 tablet (500 mg total) by mouth 2 (two) times daily with a meal.   60 tablet   0    BP 124/65  Pulse 75  Temp(Src) 97.5 F (36.4 C) (Oral)  Resp 18  Wt 153 lb 8 oz (69.627 kg)  SpO2 98% Physical Exam  Nursing note and vitals reviewed. Constitutional: He is oriented to person, place, and time. He appears well-developed and well-nourished.  HENT:  Head: Normocephalic.  Right Ear: External ear normal.  Left Ear: External ear normal.  Eyes: Pupils are equal, round, and reactive to light.  Neck: Normal range of motion.  Cardiovascular: Normal rate.   Pulmonary/Chest: Effort normal.  Musculoskeletal: Normal range of motion. He exhibits tenderness. He exhibits no edema.  Neurological: He is alert and oriented to person, place, and time.  Skin: Skin is warm. No rash noted. No erythema.    ED Course  Procedures (including critical care time) Labs Review Labs Reviewed - No data to display Imaging Review No results found.  EKG Interpretation   None       MDM   1. Chronic back  pain greater than 3 months duration   2. Foot pain, left     We'll treat patient's discomfort with his normal.  Naprosyn    Arman Filter, NP 12/23/12 [phone]

## 2013-11-10 IMAGING — CR DG CERVICAL SPINE 2 OR 3 VIEWS
2 series · 2 of 2 positions shown · non-contrast
Comparison: None.

CLINICAL DATA: Chronic neck pain

CERVICAL SPINE - 2-3 VIEW

[lateral]
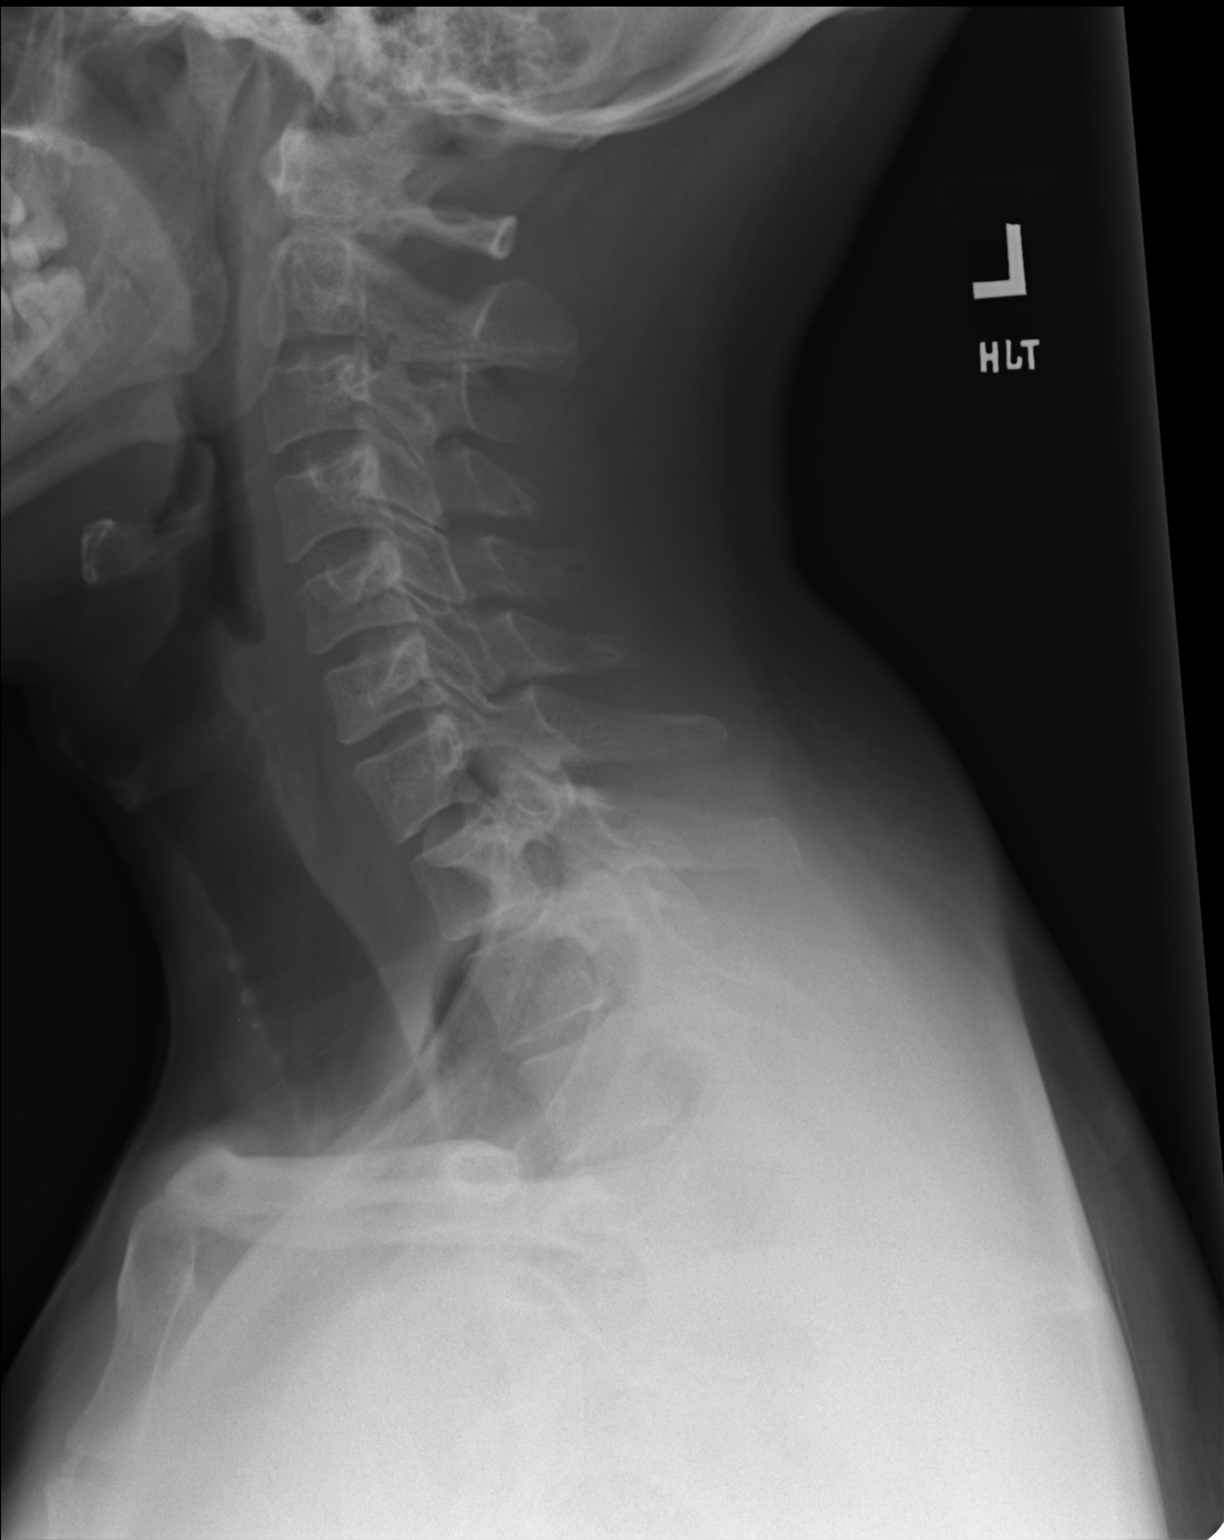

[AP]
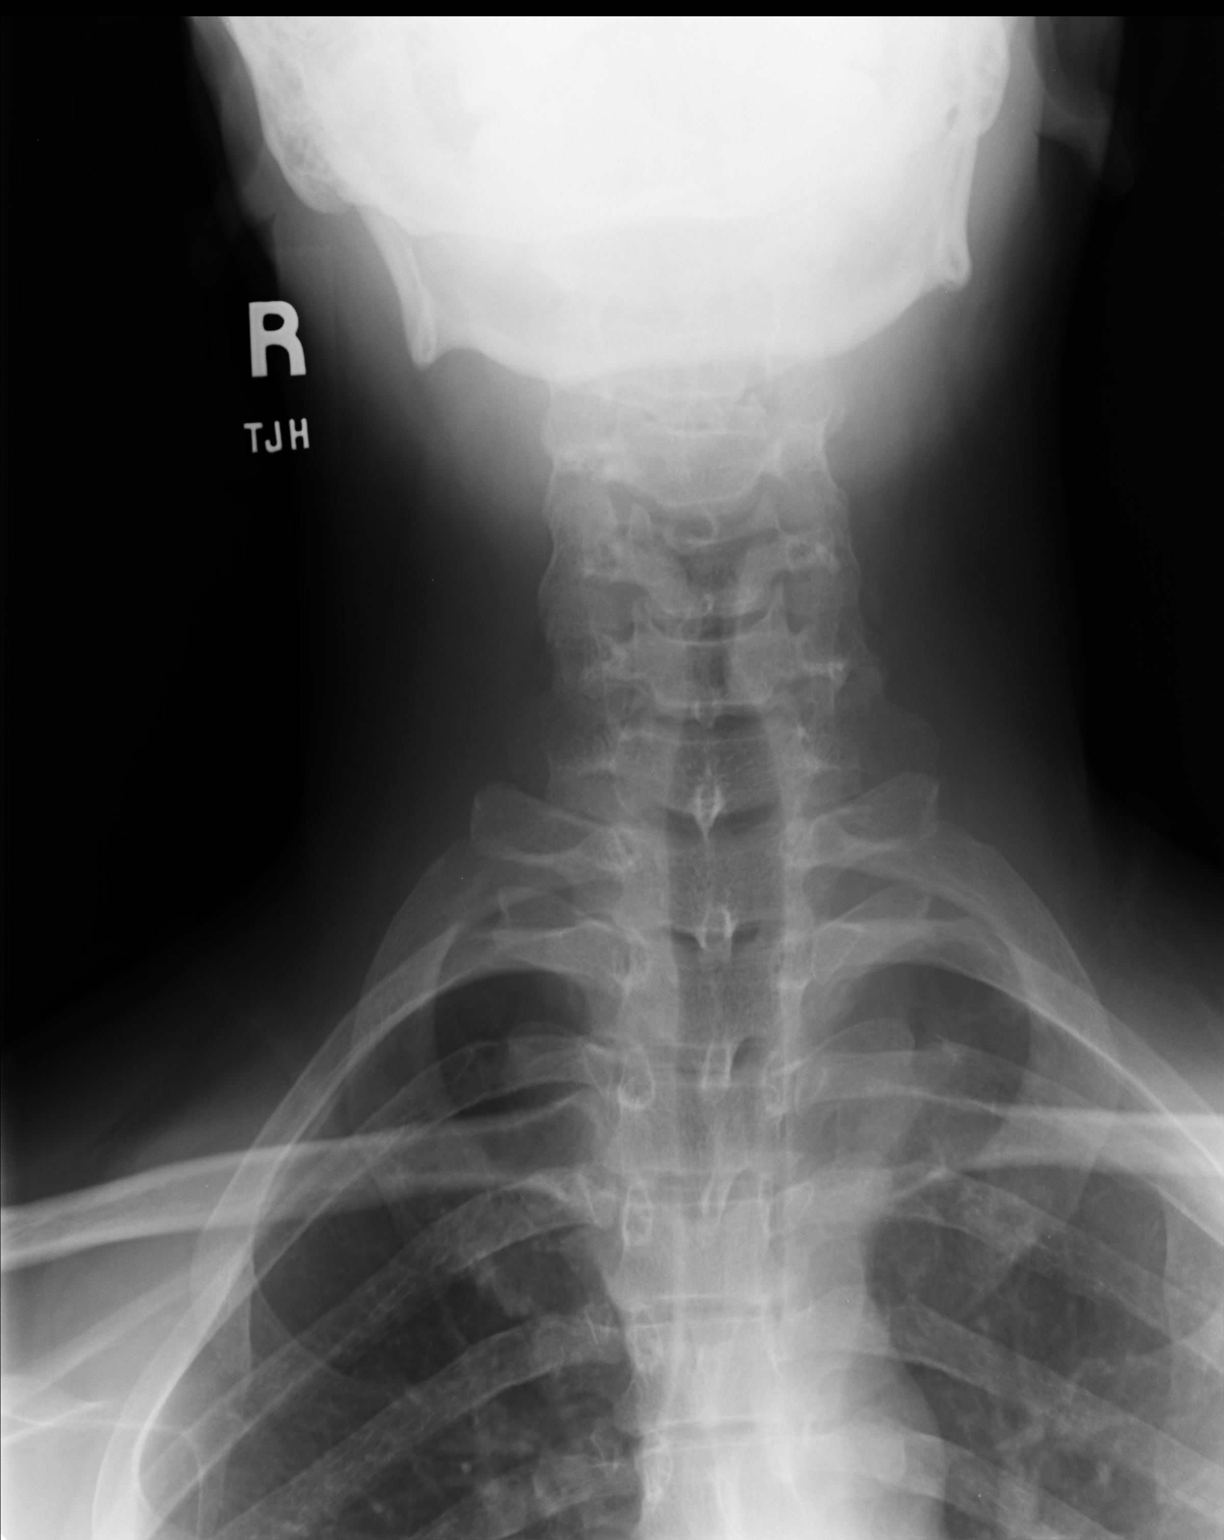

[2 of 2 positions shown; findings below may reference images not displayed]

FINDINGS: No fracture or spondylolisthesis.  Mild loss of disc
height is noted at C5-C6 and C6-C7.  No other degenerative change.
Normal soft tissues.
IMPRESSION: Minor degenerative changes.  Exam otherwise unremarkable.

## 2017-05-24 ENCOUNTER — Other Ambulatory Visit: Payer: Self-pay

## 2017-05-24 ENCOUNTER — Encounter (HOSPITAL_COMMUNITY): Payer: Self-pay | Admitting: Emergency Medicine

## 2017-05-24 ENCOUNTER — Ambulatory Visit (HOSPITAL_COMMUNITY)
Admission: EM | Admit: 2017-05-24 | Discharge: 2017-05-24 | Disposition: A | Discharge status: Home / Self Care | Payer: No Typology Code available for payment source | Attending: Internal Medicine | Admitting: Internal Medicine

## 2017-05-24 DIAGNOSIS — M25512 Pain in left shoulder: Secondary | ICD-10-CM

## 2017-05-24 MED ORDER — MELOXICAM 7.5 MG PO TABS: 7.5000 mg | ORAL_TABLET | Freq: Every day | ORAL | 0 refills | Status: DC

## 2017-05-24 MED ORDER — CYCLOBENZAPRINE HCL 5 MG PO TABS: 5.0000 mg | ORAL_TABLET | Freq: Every evening | ORAL | 0 refills | Status: DC | PRN

## 2017-05-24 NOTE — Discharge Instructions (Signed)
Start Mobic as directed. Flexeril as needed at night. Flexeril can make you drowsy, so do not take if you are going to drive, operate heavy machinery, or make important decisions. Ice/heat compresses as needed. This can take up to 3-4 weeks to completely resolve, but you should be feeling better each week. Follow up here or with PCP if symptoms worsen, changes for reevaluation.

## 2017-05-24 NOTE — ED Provider Notes (Signed)
[facility]    CSN: [id number] Arrival date & time: 05/24/17  1635     History   Chief Complaint Chief Complaint  Patient presents with  . Shoulder Pain    HPI Douglas Day is a 47 y.o. male.   47 year old male comes in for 1 week history of left shoulder pain.  States was carrying heavy equipment at work, which caused the pain to start the next morning.  No known injury/trauma.  States pain is around the left shoulder blades that goes to the shoulder.  He has full range of motion.  Denies numbness, tingling.  Has tried topical medicine and massage without relief.     Past Medical History:  Diagnosis Date  . Back pain     Patient Active Problem List   Diagnosis Date Noted  . Back pain 03/16/2012    History reviewed. No pertinent surgical history.     Home Medications    Prior to Admission medications   Medication Sig Start Date End Date Taking? Authorizing Provider  cyclobenzaprine (FLEXERIL) 5 MG tablet Take 1 tablet (5 mg total) by mouth at bedtime as needed for muscle spasms. 05/24/17   Cathie HoopsYu, Amy V, PA-C  meloxicam (MOBIC) 7.5 MG tablet Take 1 tablet (7.5 mg total) by mouth daily. 05/24/17   Belinda FisherYu, Amy V, PA-C    Family History Family History  Problem Relation Age of Onset  . Healthy Father     Social History Social History   Tobacco Use  . Smoking status: Never Smoker  Substance Use Topics  . Alcohol use: Yes    Comment: socially  . Drug use: No     Allergies   Patient has no known allergies.   Review of Systems Review of Systems  Reason unable to perform ROS: See HPI as above.     Physical Exam Triage Vital Signs ED Triage Vitals  Enc Vitals Group     BP 05/24/17 1743 126/68     Pulse Rate 05/24/17 1743 64     Resp 05/24/17 1743 18     Temp 05/24/17 1743 98.1 F (36.7 C)     Temp Source 05/24/17 1743 Oral     SpO2 05/24/17 1743 100 %     Weight --      Height --      Head Circumference --      Peak Flow --    Pain Score 05/24/17 1741 10     Pain Loc --      Pain Edu? --      Excl. in GC? --    No data found.  Updated Vital Signs BP 126/68 (BP Location: Right Arm)   Pulse 64   Temp 98.1 F (36.7 C) (Oral)   Resp 18   SpO2 100%   Physical Exam  Constitutional: He is oriented to person, place, and time. He appears well-developed and well-nourished. No distress.  HENT:  Head: Normocephalic and atraumatic.  Eyes: Pupils are equal, round, and reactive to light. Conjunctivae are normal.  Cardiovascular: Normal rate, regular rhythm and normal heart sounds. Exam reveals no gallop and no friction rub.  No murmur heard. Pulmonary/Chest: Effort normal and breath sounds normal. He has no wheezes. He has no rales.  Musculoskeletal:  No tenderness on palpation of the spinous processes.  Diffuse tenderness to palpation of left back, parascapular/trapezius muscle.  No bony tenderness. Full range of motion of shoulder. Strength normal and equal bilaterally. Sensation intact and equal bilaterally.  Radial pulses 2+ and equal bilaterally. Capillary refill less than 2 seconds.   Neurological: He is alert and oriented to person, place, and time.  Skin: Skin is warm and dry.     UC Treatments / Results  Labs (all labs ordered are listed, but only abnormal results are displayed) Labs Reviewed - No data to display  EKG None Radiology No results found.  Procedures Procedures (including critical care time)  Medications Ordered in UC Medications - No data to display   Initial Impression / Assessment and Plan / UC Course  I have reviewed the triage vital signs and the nursing notes.  Pertinent labs & imaging results that were available during my care of the patient were reviewed by me and considered in my medical decision making (see chart for details).    Start NSAID as directed for pain and inflammation. Muscle relaxant as needed. Ice/heat compresses. Discussed with patient strain can take up  to 3-4 weeks to resolve, but should be getting better each week. Return precautions given.    Final Clinical Impressions(s) / UC Diagnoses   Final diagnoses:  Acute pain of left shoulder    ED Discharge Orders        Ordered    meloxicam (MOBIC) 7.5 MG tablet  Daily     05/24/17 1902    cyclobenzaprine (FLEXERIL) 5 MG tablet  At bedtime PRN     05/24/17 1902        Belinda Fisher, PA-C 05/24/17 1905

## 2017-05-24 NOTE — ED Triage Notes (Signed)
Reports carrying a lot of heavy equipment and soreness followed.  No known injury.  Pain is left shoulder.  States pain keeps him awake at night

## 2017-08-31 ENCOUNTER — Ambulatory Visit (HOSPITAL_COMMUNITY)
Admission: EM | Admit: 2017-08-31 | Discharge: 2017-08-31 | Disposition: A | Discharge status: Home / Self Care | Payer: Self-pay | Attending: Internal Medicine | Admitting: Internal Medicine

## 2017-08-31 ENCOUNTER — Encounter (HOSPITAL_COMMUNITY): Payer: Self-pay

## 2017-08-31 DIAGNOSIS — L237 Allergic contact dermatitis due to plants, except food: Secondary | ICD-10-CM

## 2017-08-31 MED ORDER — MUPIROCIN 2 % EX OINT: 1.0000 "application " | TOPICAL_OINTMENT | Freq: Two times a day (BID) | CUTANEOUS | 0 refills | Status: DC

## 2017-08-31 MED ORDER — PREDNISONE 50 MG PO TABS: 50.0000 mg | ORAL_TABLET | Freq: Every day | ORAL | 0 refills | Status: AC

## 2017-08-31 NOTE — Discharge Instructions (Signed)
Keep area clean and dry.  You can apply Bactroban ointment where we drained the fluid.  Start prednisone for poison ivy rash.  Do not itch area.  You can apply ice if needed.  Follow-up for reevaluation if experiencing worsening symptoms such as spreading redness, increased warmth, fever.

## 2017-08-31 NOTE — ED Triage Notes (Signed)
Pt presents with complaints of worsening poison ivy

## 2017-08-31 NOTE — ED Provider Notes (Signed)
[facility]    CSN: [id number] Arrival date & time: 08/31/17  1947     History   Chief Complaint Chief Complaint  Patient presents with  . Poison Ivy    HPI Douglas Day is a 47 y.o. male.   47 year old male comes in for 2-week history of poison ivy rash that has not resolved.  States has been applying calamine lotion with mild relief.  He has to bolus on the lateral left foot that is causing increased pain and discomfort as he cannot put on his shoes.  Still has some itching to the rash on the forearms.  States rashes bilateral feet, and bilateral forearms.  States sustained this while cleaning the front yard, and realized that there was poison ivy.  Denies fever, chills, night sweats.  Denies spreading erythema, increased warmth, fever.     Past Medical History:  Diagnosis Date  . Back pain     Patient Active Problem List   Diagnosis Date Noted  . Back pain 03/16/2012    History reviewed. No pertinent surgical history.     Home Medications    Prior to Admission medications   Medication Sig Start Date End Date Taking? Authorizing Provider  cyclobenzaprine (FLEXERIL) 5 MG tablet Take 1 tablet (5 mg total) by mouth at bedtime as needed for muscle spasms. 05/24/17   Cathie HoopsYu, Iliya Spivack V, PA-C  meloxicam (MOBIC) 7.5 MG tablet Take 1 tablet (7.5 mg total) by mouth daily. 05/24/17   Cathie HoopsYu, Jolita Haefner V, PA-C  mupirocin ointment (BACTROBAN) 2 % Apply 1 application topically 2 (two) times daily. 08/31/17   Cathie HoopsYu, Brolin Dambrosia V, PA-C  predniSONE (DELTASONE) 50 MG tablet Take 1 tablet (50 mg total) by mouth daily for 5 days. 08/31/17 09/05/17  Belinda FisherYu, Armie Moren V, PA-C    Family History Family History  Problem Relation Age of Onset  . Healthy Father     Social History Social History   Tobacco Use  . Smoking status: Never Smoker  Substance Use Topics  . Alcohol use: Yes    Comment: socially  . Drug use: No     Allergies   Patient has no known allergies.   Review of Systems Review of  Systems  Reason unable to perform ROS: See HPI as above.     Physical Exam Triage Vital Signs ED Triage Vitals [08/31/17 2013]  Enc Vitals Group     BP 126/75     Pulse Rate 60     Resp 18     Temp 98.4 F (36.9 C)     Temp src      SpO2 100 %     Weight      Height      Head Circumference      Peak Flow      Pain Score 0     Pain Loc      Pain Edu?      Excl. in GC?    No data found.  Updated Vital Signs BP 126/75   Pulse 60   Temp 98.4 F (36.9 C)   Resp 18   SpO2 100%   Physical Exam  Constitutional: He is oriented to person, place, and time. He appears well-developed and well-nourished. No distress.  HENT:  Head: Normocephalic and atraumatic.  Eyes: Pupils are equal, round, and reactive to light. Conjunctivae are normal.  Neurological: He is alert and oriented to person, place, and time.  Skin: He is not diaphoretic.  Vesicular rash to the forearms  and bilateral feet.  Has to bolus on the lateral left foot.  One measuring 3 cm x 1 cm.  The other measuring 0.5 cm x 0.5 cm.     UC Treatments / Results  Labs (all labs ordered are listed, but only abnormal results are displayed) Labs Reviewed - No data to display  EKG None  Radiology No results found.  Procedures Incision and Drainage Date/Time: 08/31/2017 9:28 PM Performed by: Belinda Fisher, PA-C Authorized by: Isa Rankin, MD   Consent:    Consent obtained:  Verbal   Consent given by:  Patient   Risks discussed:  Bleeding, incomplete drainage, infection and pain   Alternatives discussed:  Alternative treatment Location:    Type:  Bulla   Size:  1cm x 3cm, 0.5cm x 0.5cm   Location:  Lower extremity   Lower extremity location:  Foot   Foot location:  L foot Pre-procedure details:    Skin preparation:  Hibiclens Anesthesia (see MAR for exact dosages):    Anesthesia method:  None Procedure type:    Complexity:  Simple Procedure details:    Needle aspiration: yes     Needle size:  18  G   Drainage:  Serous   Drainage amount:  Moderate   Wound treatment:  Wound left open   Packing materials:  None Post-procedure details:    Patient tolerance of procedure:  Tolerated well, no immediate complications   (including critical care time)  Medications Ordered in UC Medications - No data to display  Initial Impression / Assessment and Plan / UC Course  I have reviewed the triage vital signs and the nursing notes.  Pertinent labs & imaging results that were available during my care of the patient were reviewed by me and considered in my medical decision making (see chart for details).    Discussed straining to bolus to help with symptoms.  Patient would like to proceed.  Patient tolerated procedure well.  Dressed wound.  Wound care instructions given.  Bactroban ointment as needed.  Prednisone as directed.  Return precautions given.  Patient expresses understanding and agrees to plan.  Final Clinical Impressions(s) / UC Diagnoses   Final diagnoses:  Poison ivy dermatitis    ED Prescriptions    Medication Sig Dispense Auth. Provider   predniSONE (DELTASONE) 50 MG tablet Take 1 tablet (50 mg total) by mouth daily for 5 days. 5 tablet Cyan Clippinger V, PA-C   mupirocin ointment (BACTROBAN) 2 % Apply 1 application topically 2 (two) times daily. 22 g Threasa Alpha, [place] 08/31/17 2129

## 2018-02-19 ENCOUNTER — Ambulatory Visit (HOSPITAL_COMMUNITY)
Admission: EM | Admit: 2018-02-19 | Discharge: 2018-02-19 | Disposition: A | Discharge status: Home / Self Care | Payer: Self-pay | Attending: Emergency Medicine | Admitting: Emergency Medicine

## 2018-02-19 ENCOUNTER — Encounter (HOSPITAL_COMMUNITY): Payer: Self-pay | Admitting: Emergency Medicine

## 2018-02-19 DIAGNOSIS — L237 Allergic contact dermatitis due to plants, except food: Secondary | ICD-10-CM | POA: Insufficient documentation

## 2018-02-19 MED ORDER — HYDROXYZINE HCL 25 MG PO TABS: 25.0000 mg | ORAL_TABLET | Freq: Four times a day (QID) | ORAL | 0 refills | Status: AC | PRN

## 2018-02-19 MED ORDER — MUPIROCIN 2 % EX OINT: 1.0000 "application " | TOPICAL_OINTMENT | Freq: Three times a day (TID) | CUTANEOUS | 0 refills | Status: DC

## 2018-02-19 MED ORDER — PREDNISONE 10 MG PO TABS: ORAL_TABLET | ORAL | 0 refills | Status: DC

## 2018-02-19 NOTE — ED Triage Notes (Signed)
PT reports poison ivy exposure 12/31. PT has a rash on right hand and forearm.

## 2018-02-19 NOTE — Discharge Instructions (Addendum)
Use TecNu before going out in areas with known poison ivy.  This will help prevent you from getting poison ivy  If you get a rash, you can use Zanfel or TecNu extreme to deactivate the oil, which will stop the rash from spreading and help with the itching.  Apply antibacterial ointment on scabbed areas to help prevent infection.  If you were given steroids, make sure you finish all of them.  You may take Zyrtec during the day, if this does not help with your itching, then try the Atarax.  Dissolve 1 packet (or tablet) of Domeboro (aluminum acetate) in 1 pint of luke-warm water. Soak the affected areas with luke-warm Domeboro solution for 5-10 minutes twice daily. You may use apply gauze soaked in the domeboro.  Gently pat dry, Then apply the steriod / antibiotic cream. You may also take oatmeal baths with Aveeno oatmeal (1 cup in half full bathtub) or cornstarch/baking soda (1 cup each in half full bathtub). To prevent the oatmeal from caking in pipes, place it in a tied sock before dropping it into the bathtub.  Below is a list of primary care practices who are taking new patients for you to follow-up with. [facility] [address] [place], [place] [address] [phone]  [facility] [phone] [address] [place] [address]  [facility]: [address] [place] [address]  [phone]  [facility]: [address] [place] [address]   [phone]  [facility]: [address] [place] [address]  [phone]  [facility] : [address] [place] [address] [phone]  [facility]: [address] [place] [address] [phone]  [facility]: [address] [place]  [address] [phone]  Dr. Oneal Grout [address] [facility] [place] [address]  [phone]  Dr. Jackie Plum, [facility]. [address] [place], [place] [address]  [phone]  Go to www.goodrx.com to look up your medications. This will give you a list of where you can find your prescriptions at the most affordable prices. Or ask the pharmacist what the cash price is, or if they have any other discount programs available to help make your medication more affordable. This can be less expensive than what you would pay with insurance.

## 2018-02-19 NOTE — ED Provider Notes (Signed)
HPI  SUBJECTIVE:  Douglas Day is a 48 y.o. male who presents with an itchy, blistery rash on his bilateral arms, torso after working in the yard in an area with known poison ivy 1 week ago.  He states that he is itching "everywhere".  States that the rash is itchy all day long, it is not worse at night.  No fevers, crusting.  No new lotions, soaps, detergents, recent medications.  No sensation of being bitten at night, blood on the bed clothes in the morning, pets in the house.  He tried some leftover Bactroban from previous poison ivy contact dermatitis with improvement in symptoms.  No aggravating factors.  He states this is identical to the time when he had poison ivy and was seen here for this.  Past medical history negative for diabetes.  PMD: None.    Past Medical History:  Diagnosis Date  . Back pain     History reviewed. No pertinent surgical history.  Family History  Problem Relation Age of Onset  . Healthy Father     Social History   Tobacco Use  . Smoking status: Never Smoker  Substance Use Topics  . Alcohol use: Yes    Comment: socially  . Drug use: No    No current facility-administered medications for this encounter.   Current Outpatient Medications:  .  hydrOXYzine (ATARAX/VISTARIL) 25 MG tablet, Take 1 tablet (25 mg total) by mouth every 6 (six) hours as needed for up to 10 days for itching., Disp: 30 tablet, Rfl: 0 .  mupirocin ointment (BACTROBAN) 2 %, Apply 1 application topically 3 (three) times daily., Disp: 22 g, Rfl: 0 .  predniSONE (DELTASONE) 10 MG tablet, 6 tabs on day 1-2, 5 tabs on day 3-4, 4 tabs on day 5-6, 3 tabs on day 7-8, 2 tabs day 9-10, 1 tab day 11-12, Disp: 42 tablet, Rfl: 0  No Known Allergies   ROS  As noted in HPI.   Physical Exam  BP 127/73   Pulse 69   Temp 97.6 F (36.4 C) (Oral)   Resp 16   SpO2 100%   Constitutional: Well developed, well nourished, no acute distress Eyes:  EOMI, conjunctiva normal  bilaterally HENT: Normocephalic, atraumatic,mucus membranes moist Respiratory: Normal inspiratory effort Cardiovascular: Normal rate GI: nondistended skin: Positive nontender papular vesicular rash, erythematous papules over forearms, abdomen.  See pictures.  Blisters intact.  No crusting.           Musculoskeletal: no deformities Neurologic: Alert & oriented x 3, no focal neuro deficits Psychiatric: Speech and behavior appropriate   ED Course   Medications - No data to display  No orders of the defined types were placed in this encounter.   No results found for this or any previous visit (from the past 24 hour(s)). No results found.  ED Clinical Impression  Poison ivy dermatitis   ED Assessment/Plan  Presentation consistent with poison ivy dermatitis.  No evidence of secondary infection at this time.  Home with Zyrtec, 12 days of prednisone, will refill the Bactroban for when the vesicles rupture.  Will send home with Atarax if Zyrtec is not working.  Providing primary care referral list.  Discussed medical decision making, treatment plan and plan for follow-up with patient.  He agrees with plan.   Meds ordered this encounter  Medications  . hydrOXYzine (ATARAX/VISTARIL) 25 MG tablet    Sig: Take 1 tablet (25 mg total) by mouth every 6 (six) hours as needed for up  to 10 days for itching.    Dispense:  30 tablet    Refill:  0  . mupirocin ointment (BACTROBAN) 2 %    Sig: Apply 1 application topically 3 (three) times daily.    Dispense:  22 g    Refill:  0  . predniSONE (DELTASONE) 10 MG tablet    Sig: 6 tabs on day 1-2, 5 tabs on day 3-4, 4 tabs on day 5-6, 3 tabs on day 7-8, 2 tabs day 9-10, 1 tab day 11-12    Dispense:  42 tablet    Refill:  0    *This clinic note was created using Scientist, clinical (histocompatibility and immunogenetics). Therefore, there may be occasional mistakes despite careful proofreading.   ?   Domenick Gong, MD 02/20/18 1247

## 2020-02-04 ENCOUNTER — Emergency Department (HOSPITAL_COMMUNITY)
Admission: EM | Admit: 2020-02-04 | Discharge: 2020-02-05 | Disposition: A | Discharge status: Home / Self Care | Payer: Self-pay | Attending: Emergency Medicine | Admitting: Emergency Medicine

## 2020-02-04 DIAGNOSIS — Z20822 Contact with and (suspected) exposure to covid-19: Secondary | ICD-10-CM | POA: Insufficient documentation

## 2020-02-04 DIAGNOSIS — J189 Pneumonia, unspecified organism: Secondary | ICD-10-CM | POA: Insufficient documentation

## 2020-02-04 NOTE — ED Triage Notes (Signed)
Pt c/o sore throat, headache and cough since Saturday.

## 2020-02-05 ENCOUNTER — Emergency Department (HOSPITAL_COMMUNITY): Payer: Self-pay

## 2020-02-05 ENCOUNTER — Other Ambulatory Visit: Payer: Self-pay

## 2020-02-05 LAB — RESP PANEL BY RT-PCR (FLU A&B, COVID) ARPGX2
Influenza A by PCR: NEGATIVE
Influenza B by PCR: NEGATIVE
SARS Coronavirus 2 by RT PCR: NEGATIVE

## 2020-02-05 LAB — GROUP A STREP BY PCR: Group A Strep by PCR: NOT DETECTED

## 2020-02-05 MED ORDER — DOXYCYCLINE HYCLATE 100 MG PO CAPS: 100.0000 mg | ORAL_CAPSULE | Freq: Two times a day (BID) | ORAL | 0 refills | Status: DC

## 2020-02-05 MED ORDER — ACETAMINOPHEN 325 MG PO TABS
650.0000 mg | ORAL_TABLET | Freq: Once | ORAL | Status: AC | PRN
  Administered 2020-02-05: 650 mg via ORAL
  Filled 2020-02-05: qty 2

## 2020-02-05 NOTE — Discharge Instructions (Signed)
Take Doxycycline twice a day for the next 5 days Take Tylenol or Ibuprofen as needed for fever Please rest and drink plenty of fluids Return to the ER if you are having worsening symptoms (shortness of breath, weakness, dehydration)

## 2020-02-05 NOTE — ED Provider Notes (Signed)
[facility] EM[facility]GENCY DEPARTMENT Provider Note   CSN: [id number] Arrival date & time: 02/04/20  2341     History Chief Complaint  Patient presents with  . Fever    Douglas Day is a 49 y.o. male who presents with a fever. He states he started to feel sick 5 days ago. He has not been eating or sleeping well. He's had a fever and a sore throat. He also has had a wet cough that is productive of green sputum. His wife and kids have had similar symptoms but are better now. He states they went to the doctor but doesn't know what they were diagnosed with or what medicines they took to feel better. He denies CP, SOB, or wheezing.   HPI   Past Medical History:  Diagnosis Date  . Back pain     Patient Active Problem List   Diagnosis Date Noted  . Back pain 03/16/2012    No past surgical history on file.     Family History  Problem Relation Age of Onset  . Healthy Father     Social History   Tobacco Use  . Smoking status: Never Smoker  Substance Use Topics  . Alcohol use: Yes    Comment: socially  . Drug use: No    Home Medications Prior to Admission medications   Medication Sig Start Date End Date Taking? Authorizing Provider  mupirocin ointment (BACTROBAN) 2 % Apply 1 application topically 3 (three) times daily. 02/19/18   Domenick Gong, MD  predniSONE (DELTASONE) 10 MG tablet 6 tabs on day 1-2, 5 tabs on day 3-4, 4 tabs on day 5-6, 3 tabs on day 7-8, 2 tabs day 9-10, 1 tab day 11-12 02/19/18   Domenick Gong, MD    Allergies    Patient has no known allergies.  Review of Systems   Review of Systems  Constitutional: Positive for activity change, appetite change and fever.  HENT: Positive for sore throat.   Respiratory: Positive for cough. Negative for shortness of breath and wheezing.   Cardiovascular: Negative for chest pain.  Neurological: Positive for headaches.    Physical Exam Updated Vital Signs BP 120/66   Pulse 75   Temp  99.1 F (37.3 C) (Oral)   Resp 14   SpO2 95%   Physical Exam Vitals and nursing note reviewed.  Constitutional:      General: He is not in acute distress.    Appearance: Normal appearance. He is well-developed and well-nourished. He is not ill-appearing.  HENT:     Head: Normocephalic and atraumatic.     Right Ear: Tympanic membrane normal.     Left Ear: Tympanic membrane normal.     Nose: Nose normal.     Mouth/Throat:     Mouth: Mucous membranes are moist.     Pharynx: Oropharyngeal exudate and posterior oropharyngeal erythema present.  Eyes:     General: No scleral icterus.       Right eye: No discharge.        Left eye: No discharge.     Conjunctiva/sclera: Conjunctivae normal.     Pupils: Pupils are equal, round, and reactive to light.  Cardiovascular:     Rate and Rhythm: Normal rate and regular rhythm.  Pulmonary:     Effort: Pulmonary effort is normal. No respiratory distress.     Breath sounds: Rhonchi present. No wheezing.  Abdominal:     General: There is no distension.  Musculoskeletal:     Cervical  back: Normal range of motion.  Skin:    General: Skin is warm and dry.  Neurological:     Mental Status: He is alert and oriented to person, place, and time.  Psychiatric:        Mood and Affect: Mood and affect normal.        Behavior: Behavior normal.     ED Results / Procedures / Treatments   Labs (all labs ordered are listed, but only abnormal results are displayed) Labs Reviewed  RESP PANEL BY RT-PCR (FLU A&B, COVID) [id number]  GROUP A STREP BY PCR    EKG None  Radiology DG Chest 2 View  Result Date: 02/05/2020 CLINICAL DATA:  Cough and fever EXAM: CHEST - 2 VIEW COMPARISON:  None. FINDINGS: There is subtle opacity in the left base. Lungs elsewhere clear. Heart size and pulmonary vascularity are normal. No adenopathy. There is mid to lower thoracic levoscoliosis. IMPRESSION: Small area of opacity left base, likely focus of developing pneumonia. Lungs  elsewhere clear. Cardiac silhouette normal. No adenopathy. Electronically Signed   By: Bretta Bang III M.D.   On: 02/05/2020 09:50    Procedures Procedures (including critical care time)  Medications Ordered in ED Medications  acetaminophen (TYLENOL) tablet 650 mg (650 mg Oral Given 02/05/20 0008)    ED Course  I have reviewed the triage vital signs and the nursing notes.  Pertinent labs & imaging results that were available during my care of the patient were reviewed by me and considered in my medical decision making (see chart for details).  49 year old male presents with fever and respiratory illness. He is febrile on arrival. O2 sats are mid 90s but patient denies SOB. He has erythema of the pharynx and tonsillar exudates. He also has rhonchi on exam. COVID/flu test obtained in triage is negative. Will obtain strep test and CXR and reassess.   Strep test is negative. CXR shows a LLL pneumonia. The patient has no significant underlying medical problems so will treat with Doxy BID for 5 days. Supportive care and return precautions were discussed.  MDM Rules/Calculators/A&P                           Final Clinical Impression(s) / ED Diagnoses Final diagnoses:  Community acquired pneumonia of left lower lobe of lung    Rx / DC Orders ED Discharge Orders    None       Bethel Born, PA-C 02/05/20 1024    Pollyann Savoy, MD 02/05/20 [phone]

## 2020-02-15 ENCOUNTER — Emergency Department (HOSPITAL_COMMUNITY)
Admission: EM | Admit: 2020-02-15 | Discharge: 2020-02-15 | Disposition: A | Discharge status: Home / Self Care | Payer: Self-pay | Attending: Emergency Medicine | Admitting: Emergency Medicine

## 2020-02-15 ENCOUNTER — Other Ambulatory Visit: Payer: Self-pay

## 2020-02-15 ENCOUNTER — Emergency Department (HOSPITAL_COMMUNITY): Payer: Self-pay

## 2020-02-15 DIAGNOSIS — R0789 Other chest pain: Secondary | ICD-10-CM | POA: Insufficient documentation

## 2020-02-15 DIAGNOSIS — M549 Dorsalgia, unspecified: Secondary | ICD-10-CM | POA: Insufficient documentation

## 2020-02-15 MED ORDER — TRAMADOL HCL 50 MG PO TABS: 50.0000 mg | ORAL_TABLET | Freq: Four times a day (QID) | ORAL | 0 refills | Status: DC | PRN

## 2020-02-15 MED ORDER — NAPROXEN 500 MG PO TABS: 500.0000 mg | ORAL_TABLET | Freq: Two times a day (BID) | ORAL | 0 refills | Status: DC

## 2020-02-15 NOTE — ED Provider Notes (Signed)
[facility] EMERGENCY DEPARTMENT Provider Note   CSN: [id number] Arrival date & time: 02/15/20  2353     History Chief Complaint  Patient presents with  . Back Pain    Douglas Day is a 50 y.o. male.  Patient is a 50 year old male with no significant past medical history.  He presents today for evaluation of chest wall pain.  Patient states he has had this for the past several days.  He was recently treated for bronchitis with doxycycline.  Patient describes prolonged coughing episodes during this illness.  This has since cleared up, but he is now having the pain to the left lateral chest.  It is worse when he breathes deep and lies on his left side.  It is also worse with palpation.  He denies any shortness of breath or leg swelling.  The history is provided by the patient.       Past Medical History:  Diagnosis Date  . Back pain     Patient Active Problem List   Diagnosis Date Noted  . Back pain 03/16/2012    No past surgical history on file.     Family History  Problem Relation Age of Onset  . Healthy Father     Social History   Tobacco Use  . Smoking status: Never Smoker  Substance Use Topics  . Alcohol use: Yes    Comment: socially  . Drug use: No    Home Medications Prior to Admission medications   Medication Sig Start Date End Date Taking? Authorizing Provider  doxycycline (VIBRAMYCIN) 100 MG capsule Take 1 capsule (100 mg total) by mouth 2 (two) times daily. 02/05/20   Bethel Born, PA-C  mupirocin ointment (BACTROBAN) 2 % Apply 1 application topically 3 (three) times daily. 02/19/18   Domenick Gong, MD  predniSONE (DELTASONE) 10 MG tablet 6 tabs on day 1-2, 5 tabs on day 3-4, 4 tabs on day 5-6, 3 tabs on day 7-8, 2 tabs day 9-10, 1 tab day 11-12 02/19/18   Domenick Gong, MD    Allergies    Patient has no known allergies.  Review of Systems   Review of Systems  All other systems reviewed and are  negative.   Physical Exam Updated Vital Signs BP 122/77 (BP Location: Left Arm)   Pulse 82   Temp 97.6 F (36.4 C) (Oral)   Resp 16   SpO2 100%   Physical Exam Vitals and nursing note reviewed.  Constitutional:      General: He is not in acute distress.    Appearance: He is well-developed and well-nourished. He is not diaphoretic.  HENT:     Head: Normocephalic and atraumatic.     Mouth/Throat:     Mouth: Oropharynx is clear and moist.  Cardiovascular:     Rate and Rhythm: Normal rate and regular rhythm.     Heart sounds: No murmur heard. No friction rub.     Comments: There is tenderness to palpation over the left lateral ribs.  There is no palpable abnormality or crepitus.  Breath sounds are equal bilaterally. Pulmonary:     Effort: Pulmonary effort is normal. No respiratory distress.     Breath sounds: Normal breath sounds. No wheezing or rales.  Abdominal:     General: Bowel sounds are normal. There is no distension.     Palpations: Abdomen is soft.     Tenderness: There is no abdominal tenderness.  Musculoskeletal:        General:  No edema. Normal range of motion.     Cervical back: Normal range of motion and neck supple.  Skin:    General: Skin is warm and dry.  Neurological:     Mental Status: He is alert and oriented to person, place, and time.     Coordination: Coordination normal.     ED Results / Procedures / Treatments   Labs (all labs ordered are listed, but only abnormal results are displayed) Labs Reviewed - No data to display  EKG None  Radiology DG Chest 2 View  Result Date: 02/15/2020 CLINICAL DATA:  Left chest wall pain, no reported injury EXAM: CHEST - 2 VIEW COMPARISON:  02/05/2020 chest radiograph. FINDINGS: Stable cardiomediastinal silhouette with normal heart size. No pneumothorax. No pleural effusion. Lungs appear clear, with no acute consolidative airspace disease and no pulmonary edema. No displaced fractures. IMPRESSION: No active  cardiopulmonary disease. Electronically Signed   By: Delbert Phenix M.D.   On: 02/15/2020 04:16    Procedures Procedures (including critical care time)  Medications Ordered in ED Medications - No data to display  ED Course  I have reviewed the triage vital signs and the nursing notes.  Pertinent labs & imaging results that were available during my care of the patient were reviewed by me and considered in my medical decision making (see chart for details).    MDM Rules/Calculators/A&P  Patient presents today with complaints of pain to the left lateral ribs.  This began several days ago following a URI-like illness during which time he coughed excessively.  I suspect a musculoskeletal etiology as the pain is worse when I palpate the area and when he lies on his side.  Chest x-ray today is clear.  Vital signs are stable with no tachycardia, hypoxia, tachypnea and blood pressure is normal.  At this point, I feel as though discharge with anti-inflammatories, tramadol, and follow-up as needed is appropriate.  Final Clinical Impression(s) / ED Diagnoses Final diagnoses:  None    Rx / DC Orders ED Discharge Orders    None       Geoffery Lyons, MD 02/15/20 [phone]

## 2020-02-15 NOTE — ED Notes (Signed)
Pt ambulatory independently, steady even gait. Does not appear in distress, respirations are even and non-labored

## 2020-02-15 NOTE — Discharge Instructions (Addendum)
Begin taking naproxen as prescribed.  Begin taking tramadol as prescribed as needed for pain not relieved with naproxen.  Follow-up with your primary doctor if symptoms or not improving in the next week, and return to the ER if symptoms significantly worsen or change.

## 2020-02-15 NOTE — ED Triage Notes (Addendum)
Pt was her on the 23rd  And covid -and he said he has been coughing still and his left side and into his back is still hurting.Pt said he was given meds and they are not working very well

## 2020-08-01 ENCOUNTER — Other Ambulatory Visit: Payer: Self-pay

## 2020-08-01 ENCOUNTER — Encounter (HOSPITAL_COMMUNITY): Payer: Self-pay

## 2020-08-01 ENCOUNTER — Emergency Department (HOSPITAL_COMMUNITY)
Admission: EM | Admit: 2020-08-01 | Discharge: 2020-08-02 | Disposition: A | Discharge status: Home / Self Care | Payer: Self-pay | Attending: Emergency Medicine | Admitting: Emergency Medicine

## 2020-08-01 ENCOUNTER — Emergency Department (HOSPITAL_COMMUNITY): Payer: Self-pay

## 2020-08-01 DIAGNOSIS — J069 Acute upper respiratory infection, unspecified: Secondary | ICD-10-CM | POA: Insufficient documentation

## 2020-08-01 DIAGNOSIS — Z20822 Contact with and (suspected) exposure to covid-19: Secondary | ICD-10-CM | POA: Insufficient documentation

## 2020-08-01 NOTE — ED Provider Notes (Signed)
Emergency Medicine Provider Triage Evaluation Note  Douglas Day , a 50 y.o. male  was evaluated in triage.  Pt complains of 1 week of sore throat, productive cough, and new shortness of breath and chest pressure.  Denies fevers, chills, or sick contacts.  He is not vaccinated for COVID-19.Marland Kitchen  Review of Systems  Positive: Sore throat, cough, congestion, shortness of breath Negative: Nausea, vomiting, diarrhea, fevers  Physical Exam  BP 136/82 (BP Location: Left Arm)   Pulse 72   Temp 99.5 F (37.5 C) (Oral)   Resp 18   Ht 5\' 8"  (1.727 m)   Wt 70 kg   SpO2 98%   BMI 23.46 kg/m  Gen:   Awake, no distress   Resp:  Normal effort  MSK:   Moves extremities without difficulty  Other:  Wheezes throughout lung fields bilaterally, RRR, no M/R/G.  Abdomen, soft, nondistended, nontender  Medical Decision Making  Medically screening exam initiated at 11:04 PM.  Appropriate orders placed.  Heston Volkov was informed that the remainder of the evaluation will be completed by another provider, this initial triage assessment does not replace that evaluation, and the importance of remaining in the ED until their evaluation is complete.  This chart was dictated using voice recognition software, Dragon. Despite the best efforts of this provider to proofread and correct errors, errors may still occur which can change documentation meaning.    Pollyann Kennedy 08/01/20 2305    08/03/20, MD 08/05/20 [phone]

## 2020-08-01 NOTE — ED Triage Notes (Signed)
Patient reports sore throat and productive cough x 1 week, denies fever or sick contacts

## 2020-08-02 LAB — SARS CORONAVIRUS 2 (TAT 6-24 HRS): SARS Coronavirus 2: NEGATIVE

## 2020-08-02 MED ORDER — DEXAMETHASONE 4 MG PO TABS
10.0000 mg | ORAL_TABLET | Freq: Once | ORAL | Status: AC
  Administered 2020-08-02: 09:00:00 10 mg via ORAL
  Filled 2020-08-02: qty 3

## 2020-08-02 MED ORDER — BENZONATATE 100 MG PO CAPS: 100.0000 mg | ORAL_CAPSULE | Freq: Three times a day (TID) | ORAL | 0 refills | Status: DC

## 2020-08-02 MED ORDER — ALBUTEROL SULFATE HFA 108 (90 BASE) MCG/ACT IN AERS
8.0000 | INHALATION_SPRAY | Freq: Once | RESPIRATORY_TRACT | Status: AC
  Administered 2020-08-02: 09:00:00 8 via RESPIRATORY_TRACT
  Filled 2020-08-02: qty 6.7

## 2020-08-02 NOTE — ED Provider Notes (Signed)
[facility] EMERGENCY DEPARTMENT Provider Note   CSN: [id number] Arrival date & time: 08/01/20  2110     History Chief Complaint  Patient presents with   Sore Throat   Cough    Douglas Day is a 50 y.o. male.  50 yo M with a chief complaints of cough sore throat going on for the past week.  Patient felt he had persistent coughing last night that was making him have trouble breathing and so came to the ED for evaluation.  He denies any fevers or chills denies nausea vomiting or diarrhea.  Denies sick contacts.  Denies recent travel.  Had been seen previously and told that he may have asthma and was given an inhaler at that time.  Has run out of that medication.  The history is provided by the patient.  Sore Throat Associated symptoms include chest pain and shortness of breath. Pertinent negatives include no abdominal pain and no headaches.  Cough Associated symptoms: chest pain, shortness of breath and sore throat   Associated symptoms: no chills, no eye discharge, no fever, no headaches, no myalgias and no rash   Illness Severity:  Moderate Onset quality:  Gradual Duration:  1 week Timing:  Constant Progression:  Worsening Chronicity:  New Associated symptoms: chest pain, congestion, cough, shortness of breath and sore throat   Associated symptoms: no abdominal pain, no diarrhea, no fever, no headaches, no myalgias, no rash and no vomiting       Past Medical History:  Diagnosis Date   Back pain     Patient Active Problem List   Diagnosis Date Noted   Back pain 03/16/2012    History reviewed. No pertinent surgical history.     Family History  Problem Relation Age of Onset   Healthy Father     Social History   Tobacco Use   Smoking status: Never  Substance Use Topics   Alcohol use: Yes    Comment: socially   Drug use: No    Home Medications Prior to Admission medications   Medication Sig Start Date End Date Taking? Authorizing  Provider  benzonatate (TESSALON) 100 MG capsule Take 1 capsule (100 mg total) by mouth every 8 (eight) hours. 08/02/20  Yes Melene Plan, DO  doxycycline (VIBRAMYCIN) 100 MG capsule Take 1 capsule (100 mg total) by mouth 2 (two) times daily. 02/05/20   Bethel Born, PA-C  mupirocin ointment (BACTROBAN) 2 % Apply 1 application topically 3 (three) times daily. 02/19/18   Domenick Gong, MD  naproxen (NAPROSYN) 500 MG tablet Take 1 tablet (500 mg total) by mouth 2 (two) times daily with a meal. 02/15/20   Geoffery Lyons, MD  predniSONE (DELTASONE) 10 MG tablet 6 tabs on day 1-2, 5 tabs on day 3-4, 4 tabs on day 5-6, 3 tabs on day 7-8, 2 tabs day 9-10, 1 tab day 11-12 02/19/18   Domenick Gong, MD  traMADol (ULTRAM) 50 MG tablet Take 1 tablet (50 mg total) by mouth every 6 (six) hours as needed. 02/15/20   Geoffery Lyons, MD    Allergies    Patient has no known allergies.  Review of Systems   Review of Systems  Constitutional:  Negative for chills and fever.  HENT:  Positive for congestion and sore throat. Negative for facial swelling.   Eyes:  Negative for discharge and visual disturbance.  Respiratory:  Positive for cough and shortness of breath.   Cardiovascular:  Positive for chest pain. Negative for palpitations.  Gastrointestinal:  Negative for abdominal pain, diarrhea and vomiting.  Musculoskeletal:  Negative for arthralgias and myalgias.  Skin:  Negative for color change and rash.  Neurological:  Negative for tremors, syncope and headaches.  Psychiatric/Behavioral:  Negative for confusion and dysphoric mood.    Physical Exam Updated Vital Signs BP 106/70 (BP Location: Left Arm)   Pulse 66   Temp 97.8 F (36.6 C) (Oral)   Resp 16   Ht 5\' 8"  (1.727 m)   Wt 70 kg   SpO2 97%   BMI 23.46 kg/m   Physical Exam Vitals and nursing note reviewed.  Constitutional:      Appearance: He is well-developed.  HENT:     Head: Normocephalic and atraumatic.     Comments: Swollen  turbinates, posterior nasal drip.  Eyes:     Pupils: Pupils are equal, round, and reactive to light.  Neck:     Vascular: No JVD.  Cardiovascular:     Rate and Rhythm: Normal rate and regular rhythm.     Heart sounds: No murmur heard.   No friction rub. No gallop.  Pulmonary:     Effort: No respiratory distress.     Breath sounds: No wheezing.     Comments: Coarse breath sounds in all fields. Abdominal:     General: There is no distension.     Tenderness: There is no abdominal tenderness. There is no guarding or rebound.  Musculoskeletal:        General: Normal range of motion.     Cervical back: Normal range of motion and neck supple.  Skin:    Coloration: Skin is not pale.     Findings: No rash.  Neurological:     Mental Status: He is alert and oriented to person, place, and time.  Psychiatric:        Behavior: Behavior normal.    ED Results / Procedures / Treatments   Labs (all labs ordered are listed, but only abnormal results are displayed) Labs Reviewed  SARS CORONAVIRUS 2 (TAT 6-24 HRS)    EKG None  Radiology DG Chest 2 View  Result Date: 08/01/2020 CLINICAL DATA:  Shortness of breath.  Sore throat and cough EXAM: CHEST - 2 VIEW COMPARISON:  02/15/2020 FINDINGS: Heart size is normal. No pleural effusion or edema identified. The lungs are hyperinflated. No airspace opacities identified. Visualized osseous structures are unremarkable. IMPRESSION: 1. Hyperinflation. 2. No acute findings. Electronically Signed   By: 04/14/2020 M.D.   On: 08/01/2020 23:43    Procedures Procedures   Medications Ordered in ED Medications  albuterol (VENTOLIN HFA) 108 (90 Base) MCG/ACT inhaler 8 puff (has no administration in time range)  dexamethasone (DECADRON) tablet 10 mg (has no administration in time range)    ED Course  I have reviewed the triage vital signs and the nursing notes.  Pertinent labs & imaging results that were available during my care of the patient were  reviewed by me and considered in my medical decision making (see chart for details).    MDM Rules/Calculators/A&P                          50 yo M with a chief complaints of cough congestion going on for about a week.  Felt like he was having trouble sleeping last night due to the coughing and so came to the ED for evaluation.  Patient was seen in the quick look process and orders were placed  for lab work and troponins.  Patient has made it back and I was able to evaluate him prior to that evaluation.  Do not feel that labs would benefit him as it sounds most likely that he has an upper respiratory illness he is well-appearing and nontoxic.  Chest x-ray viewed by me without focal infiltrate.  As he has had reactive airway disease with an upper respiratory infection in the past we will give him an albuterol inhaler.  Dose of Decadron.  PCP follow-up.  9:10 AM: I have discussed the diagnosis/risks/treatment options with the patient and believe the pt to be eligible for discharge home to follow-up with PCP. We also discussed returning to the ED immediately if new or worsening sx occur. We discussed the sx which are most concerning (e.g., sudden worsening pain, fever, inability to tolerate by mouth, worsening sob) that necessitate immediate return. Medications administered to the patient during their visit and any new prescriptions provided to the patient are listed below.  Medications given during this visit Medications  albuterol (VENTOLIN HFA) 108 (90 Base) MCG/ACT inhaler 8 puff (has no administration in time range)  dexamethasone (DECADRON) tablet 10 mg (has no administration in time range)     The patient appears reasonably screen and/or stabilized for discharge and I doubt any other medical condition or other [facility] requiring further screening, evaluation, or treatment in the ED at this time prior to discharge.   Final Clinical Impression(s) / ED Diagnoses Final diagnoses:  Viral upper  respiratory tract infection    Rx / DC Orders ED Discharge Orders          Ordered    benzonatate (TESSALON) 100 MG capsule  Every 8 hours        08/02/20 0906             Melene Plan, DO 08/02/20 [phone]

## 2020-08-02 NOTE — Discharge Instructions (Addendum)
Take tylenol 2 pills 4 times a day and motrin 4 pills 3 times a day.  Drink plenty of fluids.  Return for worsening shortness of breath, headache, confusion. Follow up with your family doctor.    He can use the albuterol inhaler 4 puffs up to every 4 hours when you are awake if needed.  If you need to use it more often than that the need to return to the emergency department to be evaluated.

## 2021-03-08 ENCOUNTER — Encounter (HOSPITAL_COMMUNITY): Payer: Self-pay | Admitting: Emergency Medicine

## 2021-03-08 ENCOUNTER — Other Ambulatory Visit: Payer: Self-pay

## 2021-03-08 ENCOUNTER — Emergency Department (HOSPITAL_COMMUNITY)
Admission: EM | Admit: 2021-03-08 | Discharge: 2021-03-08 | Disposition: A | Discharge status: Home / Self Care | Payer: Self-pay | Attending: Emergency Medicine | Admitting: Emergency Medicine

## 2021-03-08 DIAGNOSIS — J029 Acute pharyngitis, unspecified: Secondary | ICD-10-CM | POA: Insufficient documentation

## 2021-03-08 DIAGNOSIS — Z20822 Contact with and (suspected) exposure to covid-19: Secondary | ICD-10-CM | POA: Insufficient documentation

## 2021-03-08 LAB — RESP PANEL BY RT-PCR (FLU A&B, COVID) ARPGX2
Influenza A by PCR: NEGATIVE
Influenza B by PCR: NEGATIVE
SARS Coronavirus 2 by RT PCR: NEGATIVE

## 2021-03-08 LAB — GROUP A STREP BY PCR: Group A Strep by PCR: NOT DETECTED

## 2021-03-08 MED ORDER — DEXAMETHASONE 4 MG PO TABS
10.0000 mg | ORAL_TABLET | Freq: Once | ORAL | Status: AC
Start: 2021-03-08 — End: 2021-03-08
  Administered 2021-03-08: 21:00:00 10 mg via ORAL
  Filled 2021-03-08: qty 3

## 2021-03-08 NOTE — ED Provider Notes (Signed)
[facility] EMERGENCY DEPARTMENT Provider Note   CSN: [id number] Arrival date & time: 03/08/21  1329     History  Chief Complaint  Patient presents with   Sore Throat    Douglas Day is a 51 y.o. male.  Sore throat the last several days.  No difficulty swallowing.  No major medical problems.  Denies any fevers or chills.  The history is provided by the patient.  Sore Throat This is a new problem. The current episode started more than 2 days ago. The problem occurs daily. The problem has not changed since onset.Pertinent negatives include no chest pain, no abdominal pain, no headaches and no shortness of breath. Nothing aggravates the symptoms. Nothing relieves the symptoms. He has tried nothing for the symptoms. The treatment provided no relief.      Home Medications Prior to Admission medications   Medication Sig Start Date End Date Taking? Authorizing Provider  benzonatate (TESSALON) 100 MG capsule Take 1 capsule (100 mg total) by mouth every 8 (eight) hours. 08/02/20   Melene Plan, DO  doxycycline (VIBRAMYCIN) 100 MG capsule Take 1 capsule (100 mg total) by mouth 2 (two) times daily. 02/05/20   Bethel Born, PA-C  mupirocin ointment (BACTROBAN) 2 % Apply 1 application topically 3 (three) times daily. 02/19/18   Domenick Gong, MD  naproxen (NAPROSYN) 500 MG tablet Take 1 tablet (500 mg total) by mouth 2 (two) times daily with a meal. 02/15/20   Geoffery Lyons, MD  predniSONE (DELTASONE) 10 MG tablet 6 tabs on day 1-2, 5 tabs on day 3-4, 4 tabs on day 5-6, 3 tabs on day 7-8, 2 tabs day 9-10, 1 tab day 11-12 02/19/18   Domenick Gong, MD  traMADol (ULTRAM) 50 MG tablet Take 1 tablet (50 mg total) by mouth every 6 (six) hours as needed. 02/15/20   Geoffery Lyons, MD      Allergies    Patient has no known allergies.    Review of Systems   Review of Systems  Respiratory:  Negative for shortness of breath.   Cardiovascular:  Negative for chest pain.   Gastrointestinal:  Negative for abdominal pain.  Neurological:  Negative for headaches.   Physical Exam Updated Vital Signs BP (!) 135/94 (BP Location: Left Arm)    Pulse 68    Temp 98.7 F (37.1 C) (Oral)    Resp 16    SpO2 95%  Physical Exam Vitals and nursing note reviewed.  Constitutional:      General: He is not in acute distress.    Appearance: He is well-developed.  HENT:     Head: Normocephalic and atraumatic.     Right Ear: Tympanic membrane normal.     Left Ear: Tympanic membrane normal.     Mouth/Throat:     Mouth: Mucous membranes are moist. No oral lesions.     Pharynx: Uvula midline. Posterior oropharyngeal erythema present. No pharyngeal swelling, oropharyngeal exudate or uvula swelling.     Tonsils: No tonsillar exudate or tonsillar abscesses.  Eyes:     Conjunctiva/sclera: Conjunctivae normal.  Cardiovascular:     Rate and Rhythm: Normal rate and regular rhythm.     Heart sounds: Normal heart sounds. No murmur heard. Pulmonary:     Effort: Pulmonary effort is normal. No respiratory distress.     Breath sounds: Normal breath sounds.  Abdominal:     Palpations: Abdomen is soft.     Tenderness: There is no abdominal tenderness.  Musculoskeletal:  General: No swelling.     Cervical back: Normal range of motion and neck supple.  Skin:    General: Skin is warm and dry.     Capillary Refill: Capillary refill takes less than 2 seconds.  Neurological:     Mental Status: He is alert.  Psychiatric:        Mood and Affect: Mood normal.    ED Results / Procedures / Treatments   Labs (all labs ordered are listed, but only abnormal results are displayed) Labs Reviewed  GROUP A STREP BY PCR  RESP PANEL BY RT-PCR (FLU A&B, COVID) [id number]    EKG None  Radiology No results found.  Procedures Procedures    Medications Ordered in ED Medications  dexamethasone (DECADRON) tablet 10 mg (has no administration in time range)    ED Course/ Medical  Decision Making/ A&P                           Medical Decision Making Risk Prescription drug management.   Douglas Day is a 51 year old male who presents with sore throat.  No significant medical problems.  Normal vitals.  No fever.  Has some mild erythema to his posterior oropharynx but otherwise no signs of or abscess trismus or drooling.  Symptoms for the last several days.  Differential diagnosis is strep throat versus COVID versus viral process.  Drug test and COVID test per my review are negative.  Overall suspect viral process.  Given a dose of Decadron.  Discharged in good condition.  This chart was dictated using voice recognition software.  Despite best efforts to proofread,  errors can occur which can change the documentation meaning.         Final Clinical Impression(s) / ED Diagnoses Final diagnoses:  Sore throat    Rx / DC Orders ED Discharge Orders     None         Lennice Sites, DO 03/08/21 2027

## 2021-03-08 NOTE — ED Provider Triage Note (Signed)
Emergency Medicine Provider Triage Evaluation Note  Douglas Day , a 51 y.o. male  was evaluated in triage.  Pt complains of sore throat and cough x3 days. Denies changes to phonation and difficulties swallowing.   Review of Systems  Positive: Sore throat, cough Negative: fever  Physical Exam  BP (!) 140/91 (BP Location: Right Arm)    Pulse 78    Temp 98.7 F (37.1 C) (Oral)    Resp 19    SpO2 98%  Gen:   Awake, no distress   Resp:  Normal effort  MSK:   Moves extremities without difficulty  Other:    Medical Decision Making  Medically screening exam initiated at 3:06 PM.  Appropriate orders placed.  Douglas Day was informed that the remainder of the evaluation will be completed by another provider, this initial triage assessment does not replace that evaluation, and the importance of remaining in the ED until their evaluation is complete.  Strep COVID/influenza Likely viral etiology   Douglas Stabile, PA-C 03/08/21 1507

## 2021-03-08 NOTE — ED Triage Notes (Signed)
Pt presents to ED POV. Pt c/o sore throat cough since Saturday. Airway intact

## 2021-03-08 NOTE — Discharge Instructions (Signed)
You have been treated with a long-acting dose of steroid.  Overall suspect viral issue.  COVID and strep test are negative.

## 2021-11-08 IMAGING — CR DG CHEST 2V
2 series · 2 of 2 positions shown · non-contrast
Comparison: 02/05/2020 chest radiograph.

CLINICAL DATA: Left chest wall pain, no reported injury

EXAM:
CHEST - 2 VIEW

[chest pa]
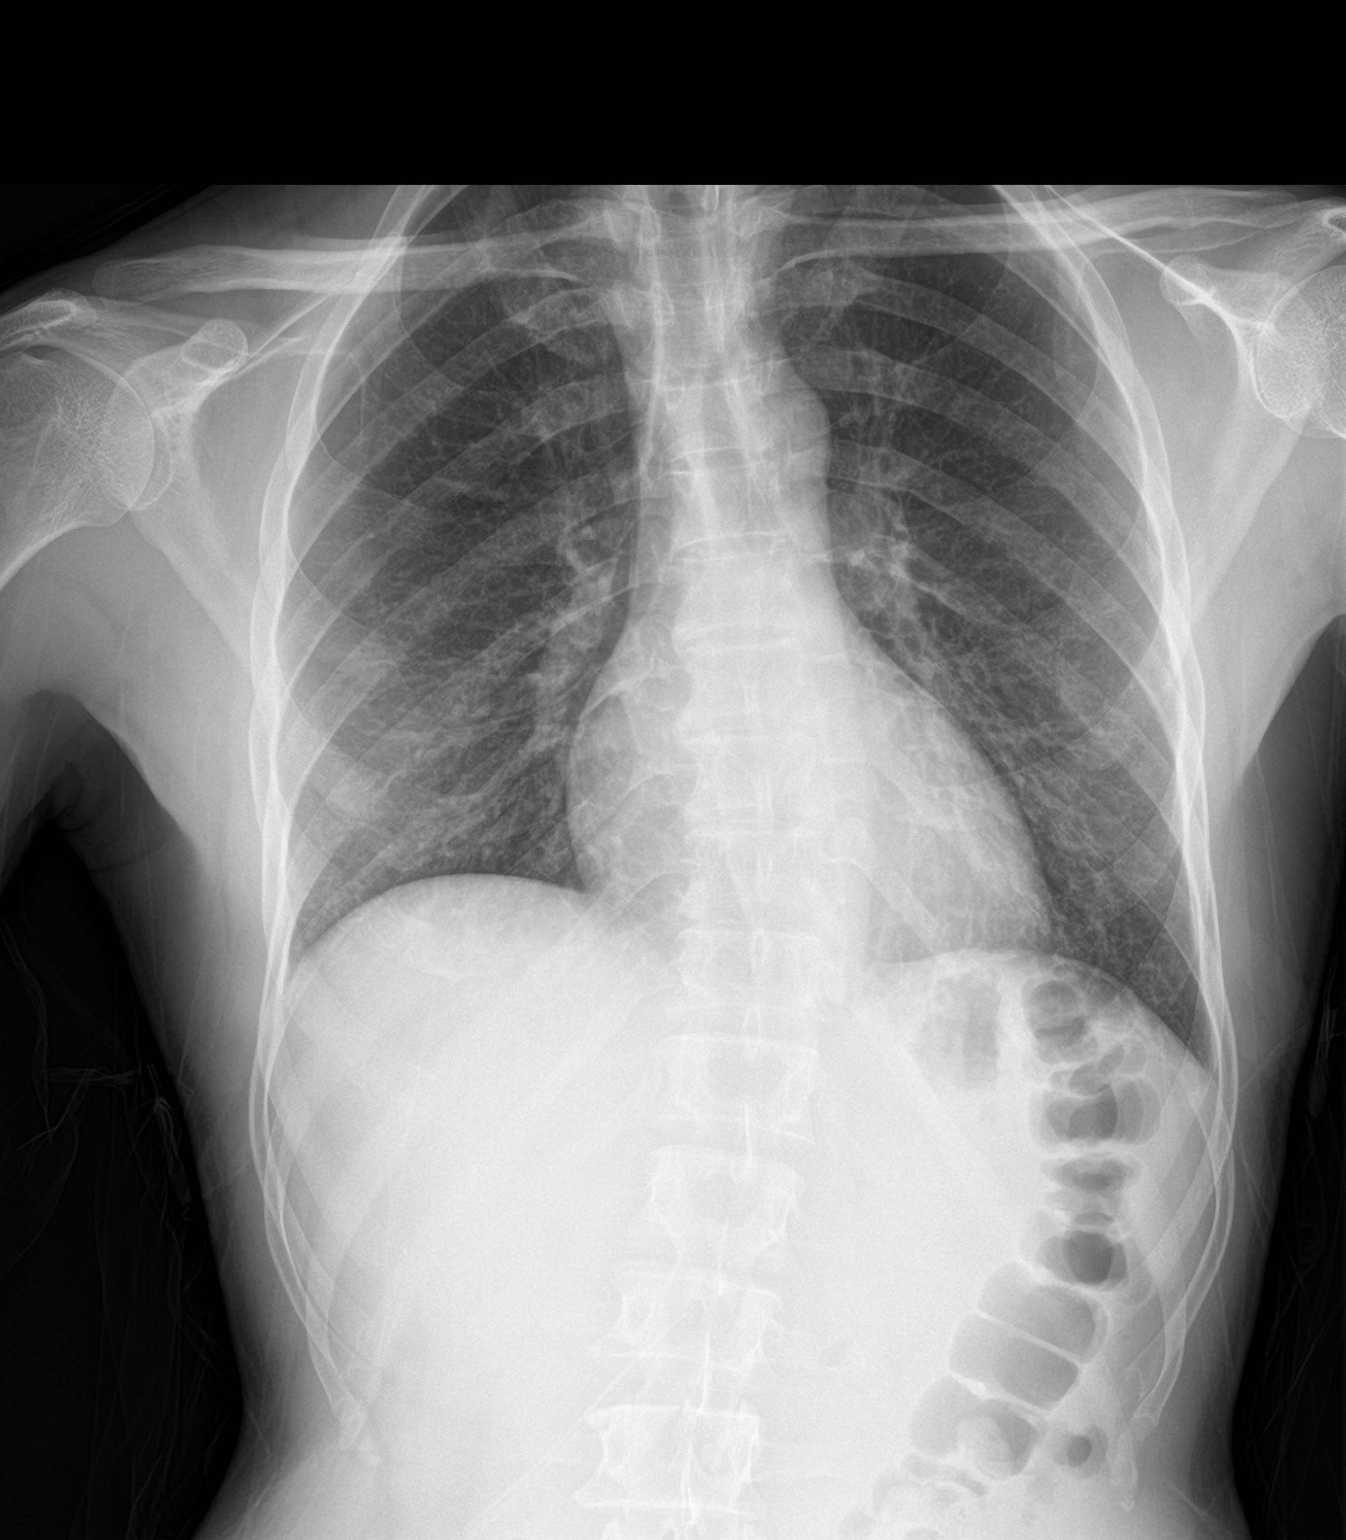

[chest lat]
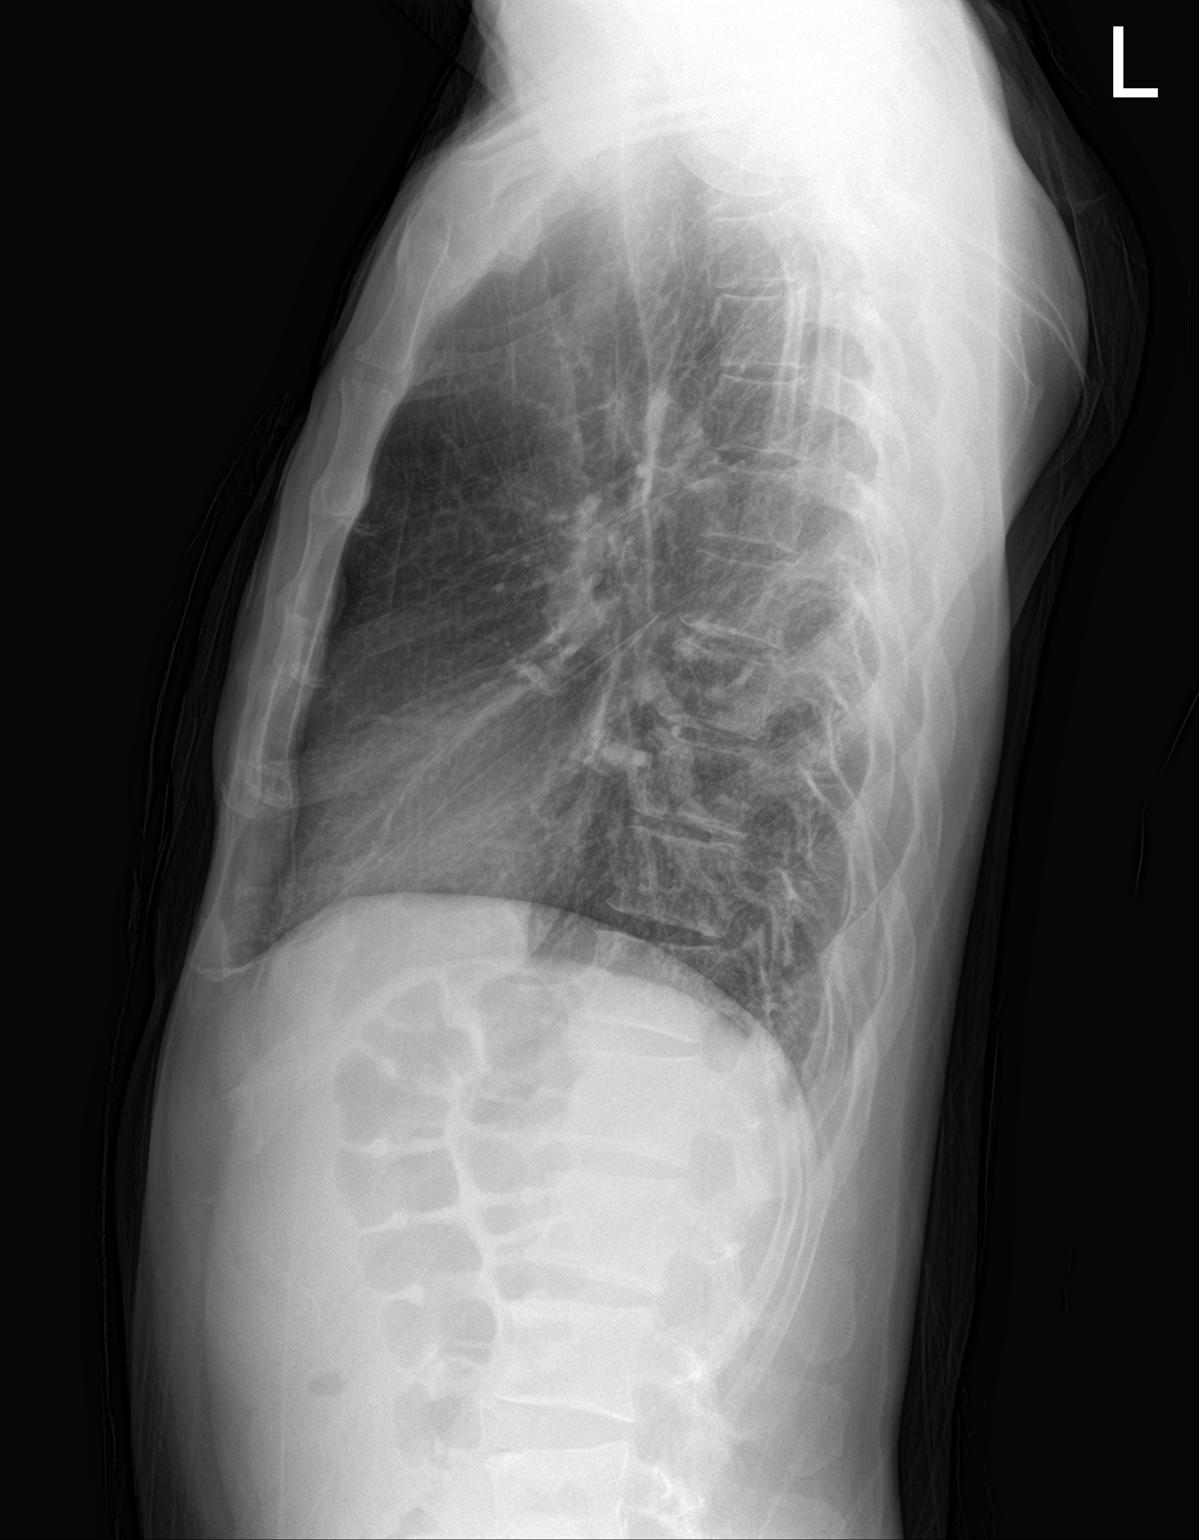

[2 of 2 positions shown; findings below may reference images not displayed]

FINDINGS: Stable cardiomediastinal silhouette with normal heart size. No
pneumothorax. No pleural effusion. Lungs appear clear, with no acute
consolidative airspace disease and no pulmonary edema. No displaced
fractures.
IMPRESSION: No active cardiopulmonary disease.

## 2021-11-21 ENCOUNTER — Encounter (HOSPITAL_COMMUNITY): Payer: Self-pay

## 2021-11-21 ENCOUNTER — Ambulatory Visit (INDEPENDENT_AMBULATORY_CARE_PROVIDER_SITE_OTHER): Payer: Self-pay

## 2021-11-21 ENCOUNTER — Ambulatory Visit (HOSPITAL_COMMUNITY)
Admission: EM | Admit: 2021-11-21 | Discharge: 2021-11-21 | Disposition: A | Discharge status: Home / Self Care | Payer: Self-pay | Attending: Physician Assistant | Admitting: Physician Assistant

## 2021-11-21 DIAGNOSIS — R059 Cough, unspecified: Secondary | ICD-10-CM

## 2021-11-21 DIAGNOSIS — J029 Acute pharyngitis, unspecified: Secondary | ICD-10-CM

## 2021-11-21 DIAGNOSIS — R051 Acute cough: Secondary | ICD-10-CM

## 2021-11-21 DIAGNOSIS — J069 Acute upper respiratory infection, unspecified: Secondary | ICD-10-CM

## 2021-11-21 MED ORDER — PROMETHAZINE-DM 6.25-15 MG/5ML PO SYRP: 5.0000 mL | ORAL_SOLUTION | Freq: Four times a day (QID) | ORAL | 0 refills | Status: DC | PRN

## 2021-11-21 MED ORDER — PREDNISONE 10 MG PO TABS: 20.0000 mg | ORAL_TABLET | Freq: Every day | ORAL | 0 refills | Status: AC

## 2021-11-21 MED ORDER — ALBUTEROL SULFATE HFA 108 (90 BASE) MCG/ACT IN AERS: 1.0000 | INHALATION_SPRAY | Freq: Four times a day (QID) | RESPIRATORY_TRACT | 0 refills | Status: DC | PRN

## 2021-11-21 NOTE — ED Provider Notes (Signed)
[facility]    CSN: [id number] Arrival date & time: 11/21/21  1637      History   Chief Complaint Chief Complaint  Patient presents with   Cough    HPI Douglas Day is a 51 y.o. male.   Pt complains of congestion and cough that started about 6 days ago.  Daughter and wife are sick with similar sx.  He reports over the last two days he has been experiencing more chest congestion.  Reports mild wheezing at night.  He reports he has a history of asthma.  Does not currently have a rescue inhaler.  He has been taking OTC cold and cough medications.  Reports improvement of congestion and rhinorrhea, but no improvement of productive cough.  Denies fever, chills, n/v/d.     Past Medical History:  Diagnosis Date   Back pain     Patient Active Problem List   Diagnosis Date Noted   Back pain 03/16/2012    History reviewed. No pertinent surgical history.     Home Medications    Prior to Admission medications   Medication Sig Start Date End Date Taking? Authorizing Provider  albuterol (VENTOLIN HFA) 108 (90 Base) MCG/ACT inhaler Inhale 1-2 puffs into the lungs every 6 (six) hours as needed for wheezing or shortness of breath. 11/21/21  Yes Ward, Tylene Fantasia, PA-C  predniSONE (DELTASONE) 10 MG tablet Take 2 tablets (20 mg total) by mouth daily for 5 days. 11/21/21 11/26/21 Yes Ward, Tylene Fantasia, PA-C  promethazine-dextromethorphan (PROMETHAZINE-DM) 6.25-15 MG/5ML syrup Take 5 mLs by mouth 4 (four) times daily as needed for cough. 11/21/21  Yes Ward, Tylene Fantasia, PA-C    Family History Family History  Problem Relation Age of Onset   Healthy Father     Social History Social History   Tobacco Use   Smoking status: Never  Substance Use Topics   Alcohol use: Yes    Comment: socially   Drug use: No     Allergies   Patient has no known allergies.   Review of Systems Review of Systems  Constitutional:  Negative for fever.  HENT:  Positive for congestion.  Negative for ear pain and sore throat.   Eyes:  Negative for pain and visual disturbance.  Respiratory:  Positive for cough and wheezing. Negative for shortness of breath.   Cardiovascular:  Negative for chest pain and palpitations.  Gastrointestinal:  Negative for abdominal pain and vomiting.  Genitourinary:  Negative for dysuria and hematuria.  Musculoskeletal:  Negative for arthralgias and back pain.  Skin:  Negative for color change and rash.  Neurological:  Negative for seizures and syncope.  All other systems reviewed and are negative.    Physical Exam Triage Vital Signs ED Triage Vitals [11/21/21 1820]  Enc Vitals Group     BP 137/86     Pulse Rate 72     Resp 16     Temp 98.3 F (36.8 C)     Temp Source Oral     SpO2 95 %     Weight      Height      Head Circumference      Peak Flow      Pain Score      Pain Loc      Pain Edu?      Excl. in GC?    No data found.  Updated Vital Signs BP 137/86 (BP Location: Right Arm)   Pulse 72   Temp 98.3 F (36.8  C) (Oral)   Resp 16   SpO2 95%   Visual Acuity Right Eye Distance:   Left Eye Distance:   Bilateral Distance:    Right Eye Near:   Left Eye Near:    Bilateral Near:     Physical Exam Vitals and nursing note reviewed.  Constitutional:      General: He is not in acute distress.    Appearance: He is well-developed.  HENT:     Head: Normocephalic and atraumatic.  Eyes:     Conjunctiva/sclera: Conjunctivae normal.  Cardiovascular:     Rate and Rhythm: Normal rate and regular rhythm.     Heart sounds: No murmur heard. Pulmonary:     Effort: Pulmonary effort is normal. No respiratory distress.     Breath sounds: Examination of the right-middle field reveals rhonchi. Examination of the left-middle field reveals rhonchi. Examination of the right-lower field reveals rhonchi. Examination of the left-lower field reveals rhonchi. Rhonchi present.  Abdominal:     Palpations: Abdomen is soft.     Tenderness:  There is no abdominal tenderness.  Musculoskeletal:        General: No swelling.     Cervical back: Neck supple.  Skin:    General: Skin is warm and dry.     Capillary Refill: Capillary refill takes less than 2 seconds.  Neurological:     Mental Status: He is alert.  Psychiatric:        Mood and Affect: Mood normal.      UC Treatments / Results  Labs (all labs ordered are listed, but only abnormal results are displayed) Labs Reviewed - No data to display  EKG   Radiology DG Chest 2 View  Result Date: 11/21/2021 CLINICAL DATA:  Chest congestion, cough, and sore throat for 2 days. EXAM: CHEST - 2 VIEW COMPARISON:  08/01/2020 FINDINGS: Normal heart size and pulmonary vascularity. No focal airspace disease or consolidation in the lungs. No blunting of costophrenic angles. No pneumothorax. Mediastinal contours appear intact. Mild thoracic scoliosis convex towards the left. IMPRESSION: No active cardiopulmonary disease. Electronically Signed   By: Lucienne Capers M.D.   On: 11/21/2021 19:05    Procedures Procedures (including critical care time)  Medications Ordered in UC Medications - No data to display  Initial Impression / Assessment and Plan / UC Course  I have reviewed the triage vital signs and the nursing notes.  Pertinent labs & imaging results that were available during my care of the patient were reviewed by me and considered in my medical decision making (see chart for details).     URI, cough.  Chest xray negative in clinic today.  Pt overall well appearing, in no acute distress.  Stable for discharge. Will prescribed albuterol, prednisone, and cough syrup.  Supportive care discussed. Return precautions discussed.  Final Clinical Impressions(s) / UC Diagnoses   Final diagnoses:  Viral URI with cough  Acute cough     Discharge Instructions      Take prednisone daily as directed, avoid NSAIDS like Aleve or Ibuprofen while on prednisone.  Take cough syrup as  needed for cough.  Use inhaler as needed for wheezing or shortness of breath.  Drink plenty of fluids Can take Mucinex and use Flonase for congestion and sinus pressure Return if you develop new or worsening symptoms.      ED Prescriptions     Medication Sig Dispense Auth. Provider   predniSONE (DELTASONE) 10 MG tablet Take 2 tablets (20 mg total) by  mouth daily for 5 days. 10 tablet Ward, Tylene Fantasia, PA-C   promethazine-dextromethorphan (PROMETHAZINE-DM) 6.25-15 MG/5ML syrup Take 5 mLs by mouth 4 (four) times daily as needed for cough. 118 mL Ward, Shanda Bumps Z, PA-C   albuterol (VENTOLIN HFA) 108 (90 Base) MCG/ACT inhaler Inhale 1-2 puffs into the lungs every 6 (six) hours as needed for wheezing or shortness of breath. 1 each Ward, Tylene Fantasia, PA-C      PDMP not reviewed this encounter.   Ward, Tylene Fantasia, PA-C 11/21/21 1926

## 2021-11-21 NOTE — Discharge Instructions (Signed)
Take prednisone daily as directed, avoid NSAIDS like Aleve or Ibuprofen while on prednisone.  Take cough syrup as needed for cough.  Use inhaler as needed for wheezing or shortness of breath.  Drink plenty of fluids Can take Mucinex and use Flonase for congestion and sinus pressure Return if you develop new or worsening symptoms.

## 2021-11-21 NOTE — ED Triage Notes (Signed)
Patient with cough, congestion and sore throat for about 2 days. States he tried some OTC medicine with no relief.

## 2021-12-11 ENCOUNTER — Encounter (HOSPITAL_BASED_OUTPATIENT_CLINIC_OR_DEPARTMENT_OTHER): Payer: Self-pay

## 2021-12-11 ENCOUNTER — Emergency Department (HOSPITAL_BASED_OUTPATIENT_CLINIC_OR_DEPARTMENT_OTHER)
Admission: EM | Admit: 2021-12-11 | Discharge: 2021-12-11 | Disposition: A | Discharge status: Home / Self Care | Payer: Self-pay | Attending: Emergency Medicine | Admitting: Emergency Medicine

## 2021-12-11 ENCOUNTER — Other Ambulatory Visit: Payer: Self-pay

## 2021-12-11 DIAGNOSIS — K0889 Other specified disorders of teeth and supporting structures: Secondary | ICD-10-CM | POA: Insufficient documentation

## 2021-12-11 MED ORDER — AMOXICILLIN-POT CLAVULANATE 875-125 MG PO TABS: 1.0000 | ORAL_TABLET | Freq: Two times a day (BID) | ORAL | 0 refills | Status: DC

## 2021-12-11 NOTE — ED Triage Notes (Signed)
Pt c/o dental pain for the past couple weeks. States one of his right upper teeth has actually broke off.

## 2021-12-11 NOTE — ED Provider Notes (Signed)
[facility] DEPT Provider Note   CSN: [id number] Arrival date & time: 12/11/21  1540     History Chief Complaint  Patient presents with   Dental Pain    Kasson Biby is a 51 y.o. male otherwise healthy presents the emergency department for evaluation of right-sided dental pain for the past few weeks to months.  Patient reports that he has been having worsening pain over the past few weeks of his right upper and right lower teeth.  He reports he still able to eat and drink although he does have some pain.  Denies any fevers, chest pain, shortness of breath, trouble swallowing.  He has not tried any medications for pain relief.  No known drug allergies.   Dental Pain Associated symptoms: no fever        Home Medications Prior to Admission medications   Medication Sig Start Date End Date Taking? Authorizing Provider  albuterol (VENTOLIN HFA) 108 (90 Base) MCG/ACT inhaler Inhale 1-2 puffs into the lungs every 6 (six) hours as needed for wheezing or shortness of breath. 11/21/21   Ward, Lenise Arena, PA-C  promethazine-dextromethorphan (PROMETHAZINE-DM) 6.25-15 MG/5ML syrup Take 5 mLs by mouth 4 (four) times daily as needed for cough. 11/21/21   Ward, Lenise Arena, PA-C      Allergies    Patient has no known allergies.    Review of Systems   Review of Systems  Constitutional:  Negative for chills and fever.  HENT:  Positive for dental problem. Negative for trouble swallowing.   Respiratory:  Negative for shortness of breath.   Cardiovascular:  Negative for chest pain.    Physical Exam Updated Vital Signs BP (!) 122/92 (BP Location: Right Arm)   Pulse (!) 55   Temp 98.1 F (36.7 C) (Oral)   Resp 18   Ht 5\' 8"  (1.727 m)   Wt 68 kg   SpO2 99%   BMI 22.81 kg/m  Physical Exam Vitals and nursing note reviewed.  Constitutional:      General: He is not in acute distress.    Appearance: Normal appearance. He is not ill-appearing or toxic-appearing.   HENT:     Head:     Comments: No facial swelling noted.    Right Ear: Tympanic membrane, ear canal and external ear normal.     Left Ear: Tympanic membrane, ear canal and external ear normal.     Ears:     Comments: No mastoid tenderness or swelling or overlying skin changes noted.  TMs are clear.    Mouth/Throat:     Mouth: Mucous membranes are moist.     Pharynx: No oropharyngeal exudate or posterior oropharyngeal erythema.     Comments: No pharyngeal erythema, edema, or exudate noted.  Airway patent.  Uvula midline.  Poor dentition noted throughout, however there is no palpable abscess, fluctuance, or induration noted to the areas.  Some mild gingival erythema, no Ludwig sign.  No trismus, patient can open his mouth greater than 3 fingerbreadths.  Controlling secretions. Eyes:     General: No scleral icterus. Pulmonary:     Effort: Pulmonary effort is normal. No respiratory distress.  Musculoskeletal:     Cervical back: Normal range of motion.  Lymphadenopathy:     Cervical: No cervical adenopathy.  Skin:    General: Skin is dry.     Findings: No rash.  Neurological:     General: No focal deficit present.     Mental Status: He is alert. Mental status  is at baseline.     Cranial Nerves: No cranial nerve deficit.  Psychiatric:        Mood and Affect: Mood normal.     ED Results / Procedures / Treatments   Labs (all labs ordered are listed, but only abnormal results are displayed) Labs Reviewed - No data to display  EKG None  Radiology No results found.  Procedures Procedures   Medications Ordered in ED Medications - No data to display  ED Course/ Medical Decision Making/ A&P                           Medical Decision Making Risk Prescription drug management.   51 year old male presents the emergency room for evaluation of right-sided dental pain for the past few weeks to months.  Differential diagnosis includes but is not limited to cavities, poor dentition,  gingivitis, abscess, deep space infection.  Vital signs show slightly elevated blood pressure 150/96 otherwise afebrile, normal pulse rate, satting well on room air with any increased work of breathing.  Physical exam as noted above.  Physical exam was not consistent with any deep space infection, drainable abscess, Ludwick's, or cellulitis.  Vital signs not consistent with any sepsis.  Patient likely has dental pain due to his poor dentition.  Has not seen dentist in a few years.  We will prescribe him some Augmentin.  Recommended taking 600 mg of ibuprofen in the 1000 mg of Tylenol every 6 hours as needed for pain.  We will send him home with Augmentin for an antibiotic.  Dental resources given.  Recommended patient start calling the numbers on this paper to see you can schedule him for an appointment.  We discussed return precautions and red flag symptoms.  Patient verbalizes understanding agrees the plan.  Patient is stable being discharged home in good condition.  Final Clinical Impression(s) / ED Diagnoses Final diagnoses:  Pain, dental    Rx / DC Orders ED Discharge Orders          Ordered    amoxicillin-clavulanate (AUGMENTIN) 875-125 MG tablet  Every 12 hours        12/11/21 2200              Sherrell Puller, PA-C 12/14/21 1402    Wyvonnia Dusky, MD 12/14/21 1517

## 2021-12-11 NOTE — Discharge Instructions (Addendum)
You were seen in the emergency room today for evaluation of dental pain.  You do have some poor dentition, do not see any obvious abscess for drainage.  I am sending you home on some Augmentin which you will take twice daily for the next 7 days.  Do not miss a dose! you can take 600 mg of ibuprofen and 1000 g of Tylenol every 6 hours as needed for pain.  I have included some dentist in the area.  Please make sure you call to schedule an appointment.  If you have any concerns, new or worsening symptoms, please return to the emergency department for evaluation.  Contact a dental care provider if: You have any unexplained dental pain. Your pain is not controlled with medicines. Your symptoms get worse. You have new symptoms. Get help right away if: You are unable to open your mouth. You are having trouble breathing or swallowing. You have a fever. You notice that your face, neck, or jaw is swollen. These symptoms may represent a serious problem that is an emergency. Do not wait to see if the symptoms will go away. Get medical help right away. Call your local emergency services (911 in the [place] Do not drive yourself to the hospital.

## 2022-07-11 ENCOUNTER — Ambulatory Visit: Payer: Medicaid Other | Admitting: Medical

## 2022-08-08 ENCOUNTER — Encounter: Payer: Self-pay | Admitting: Critical Care Medicine

## 2022-08-08 ENCOUNTER — Telehealth: Payer: Self-pay

## 2022-08-08 ENCOUNTER — Ambulatory Visit: Payer: Medicaid Other | Attending: Critical Care Medicine | Admitting: Critical Care Medicine

## 2022-08-08 VITALS — BP 117/73 | HR 65 | Ht 66.25 in | Wt 140.4 lb

## 2022-08-08 DIAGNOSIS — J4540 Moderate persistent asthma, uncomplicated: Secondary | ICD-10-CM | POA: Insufficient documentation

## 2022-08-08 DIAGNOSIS — R21 Rash and other nonspecific skin eruption: Secondary | ICD-10-CM | POA: Insufficient documentation

## 2022-08-08 DIAGNOSIS — Z1211 Encounter for screening for malignant neoplasm of colon: Secondary | ICD-10-CM | POA: Diagnosis not present

## 2022-08-08 DIAGNOSIS — Z139 Encounter for screening, unspecified: Secondary | ICD-10-CM | POA: Diagnosis not present

## 2022-08-08 DIAGNOSIS — M545 Low back pain, unspecified: Secondary | ICD-10-CM

## 2022-08-08 DIAGNOSIS — R079 Chest pain, unspecified: Secondary | ICD-10-CM | POA: Diagnosis not present

## 2022-08-08 DIAGNOSIS — G8929 Other chronic pain: Secondary | ICD-10-CM | POA: Diagnosis not present

## 2022-08-08 MED ORDER — PROAIR RESPICLICK 108 (90 BASE) MCG/ACT IN AEPB: 2.0000 | INHALATION_SPRAY | Freq: Four times a day (QID) | RESPIRATORY_TRACT | 4 refills | Status: DC | PRN

## 2022-08-08 MED ORDER — CLOBETASOL PROPIONATE 0.05 % EX CREA: 1.0000 | TOPICAL_CREAM | Freq: Two times a day (BID) | CUTANEOUS | 0 refills | Status: DC

## 2022-08-08 MED ORDER — DICLOFENAC SODIUM 1 % EX GEL: CUTANEOUS | 0 refills | Status: DC

## 2022-08-08 NOTE — Assessment & Plan Note (Signed)
Related to poison ivy exposure will provide topical steroid

## 2022-08-08 NOTE — Assessment & Plan Note (Signed)
Likely musculoskeletal doubt intrapulmonary but will check chest x-ray in any case  Patient will use diclofenac gel on affected areas

## 2022-08-08 NOTE — Addendum Note (Signed)
Addended by: Storm Frisk on: 08/08/2022 01:18 PM   Modules accepted: Orders

## 2022-08-08 NOTE — Telephone Encounter (Signed)
At the request of Dr Delford Field, I met with the patient to discuss his concerns about the mold in his apartment.  I explained to him that I can make a referral to [employer] [place] to assist with addressing the concerns with the landlord.  I explained that there is no cost for the services.  He stated that he did not want to make a referral at this time and asked me to hold off. He said he will speak to the landlord again with hopes to resolve the issue and if the issue is not resolved, he will call me.

## 2022-08-08 NOTE — Assessment & Plan Note (Signed)
Symptom complex consistent with moderate persistent asthma.  Plan for this patient to receive albuterol as needed and will check IgE levels.  I want him to meet with legal aid to see if he can get out of his rent however he declined this service at this time

## 2022-08-08 NOTE — Progress Notes (Addendum)
New Patient Office Visit  Subjective    Patient ID: Douglas Day, male    DOB: 22-Jul-1970  Age: 52 y.o. MRN: [id number]  CC:  Chief Complaint  Patient presents with   New Patient (Initial Visit)    HPI Steadman Arcia presents to establish care This is a 52 year old male who is seen to establish care.  He is accompanied by his spouse.  Complaints today include that of a rash on the forehead after poison ivy exposure.  He also complains of left-sided chest pain posteriorly.  He works in Occupational [employer].  He states the house they are renting has mold in it and this is caused increased flare in his asthma symptoms.  There are no other real complaints.  Blood pressure on arrival 117/73.  There is a concern over food access but he does have the snap program Outpatient Encounter Medications as of 08/08/2022  Medication Sig   Albuterol Sulfate (PROAIR RESPICLICK) 108 (90 Base) MCG/ACT AEPB Inhale 2 puffs into the lungs every 6 (six) hours as needed.   clobetasol cream (TEMOVATE) 0.05 % Apply 1 Application topically 2 (two) times daily. To forehead   diclofenac Sodium (VOLTAREN) 1 % GEL Apply to back 4 times a day as needed for pain   [DISCONTINUED] albuterol (VENTOLIN HFA) 108 (90 Base) MCG/ACT inhaler Inhale 1-2 puffs into the lungs every 6 (six) hours as needed for wheezing or shortness of breath.   [DISCONTINUED] amoxicillin-clavulanate (AUGMENTIN) 875-125 MG tablet Take 1 tablet by mouth every 12 (twelve) hours.   [DISCONTINUED] promethazine-dextromethorphan (PROMETHAZINE-DM) 6.25-15 MG/5ML syrup Take 5 mLs by mouth 4 (four) times daily as needed for cough.   No facility-administered encounter medications on file as of 08/08/2022.    Past Medical History:  Diagnosis Date   Asthma    Back pain     History reviewed. No pertinent surgical history.  Family History  Problem Relation Age of Onset   Healthy Father     Social History   Socioeconomic History    Marital status: Single    Spouse name: Not on file   Number of children: Not on file   Years of education: Not on file   Highest education level: Not on file  Occupational History   Not on file  Tobacco Use   Smoking status: Never   Smokeless tobacco: Not on file  Vaping Use   Vaping Use: Never used  Substance and Sexual Activity   Alcohol use: Not Currently    Comment: socially   Drug use: No   Sexual activity: Yes  Other Topics Concern   Not on file  Social History Narrative   Not on file   Social Determinants of Health   Financial Resource Strain: Not on file  Food Insecurity: Not on file  Transportation Needs: Not on file  Physical Activity: Not on file  Stress: Not on file  Social Connections: Not on file  Intimate Partner Violence: Not on file    Review of Systems  Constitutional:  Negative for chills, diaphoresis, fever, malaise/fatigue and weight loss.  HENT:  Negative for congestion, hearing loss, nosebleeds, sore throat and tinnitus.   Eyes:  Negative for blurred vision, photophobia and redness.  Respiratory:  Positive for cough. Negative for hemoptysis, sputum production, shortness of breath, wheezing and stridor.   Cardiovascular:  Negative for chest pain, palpitations, orthopnea, claudication, leg swelling and PND.  Gastrointestinal:  Negative for abdominal pain, blood in stool, constipation, diarrhea, heartburn, nausea and vomiting.  Genitourinary:  Negative for dysuria, flank pain, frequency, hematuria and urgency.  Musculoskeletal:  Negative for back pain, falls, joint pain, myalgias and neck pain.       Left posterior rib pain  Skin:  Negative for itching and rash.  Neurological:  Negative for dizziness, tingling, tremors, sensory change, speech change, focal weakness, seizures, loss of consciousness, weakness and headaches.  Endo/Heme/Allergies:  Negative for environmental allergies and polydipsia. Does not bruise/bleed easily.  Psychiatric/Behavioral:   Negative for depression, memory loss, substance abuse and suicidal ideas. The patient is not nervous/anxious and does not have insomnia.         Objective    BP 117/73 (BP Location: Right Arm, Patient Position: Sitting, Cuff Size: Normal)   Pulse 65   Ht 5' 6.25" (1.683 m)   Wt 140 lb 6.4 oz (63.7 kg)   SpO2 98%   BMI 22.49 kg/m   Physical Exam Vitals reviewed.  Constitutional:      Appearance: Normal appearance. He is well-developed. He is not diaphoretic.  HENT:     Head: Normocephalic and atraumatic.     Nose: No nasal deformity, septal deviation, mucosal edema or rhinorrhea.     Right Sinus: No maxillary sinus tenderness or frontal sinus tenderness.     Left Sinus: No maxillary sinus tenderness or frontal sinus tenderness.     Mouth/Throat:     Pharynx: No oropharyngeal exudate.  Eyes:     General: No scleral icterus.    Conjunctiva/sclera: Conjunctivae normal.     Pupils: Pupils are equal, round, and reactive to light.  Neck:     Thyroid: No thyromegaly.     Vascular: No carotid bruit or JVD.     Trachea: Trachea normal. No tracheal tenderness or tracheal deviation.  Cardiovascular:     Rate and Rhythm: Normal rate and regular rhythm.     Chest Wall: PMI is not displaced.     Pulses: Normal pulses. No decreased pulses.     Heart sounds: Normal heart sounds, S1 normal and S2 normal. Heart sounds not distant. No murmur heard.    No systolic murmur is present.     No diastolic murmur is present.     No friction rub. No gallop. No S3 or S4 sounds.  Pulmonary:     Effort: Pulmonary effort is normal. No tachypnea, accessory muscle usage or respiratory distress.     Breath sounds: No stridor. No decreased breath sounds, wheezing, rhonchi or rales.     Comments: Distant breath sounds Chest:     Chest wall: No tenderness.  Abdominal:     General: Bowel sounds are normal. There is no distension.     Palpations: Abdomen is soft. Abdomen is not rigid.     Tenderness:  There is no abdominal tenderness. There is no guarding or rebound.  Musculoskeletal:        General: Normal range of motion.     Cervical back: Normal range of motion and neck supple. No edema, erythema or rigidity. No muscular tenderness. Normal range of motion.     Comments: Tender left lower ribs  Lymphadenopathy:     Head:     Right side of head: No submental or submandibular adenopathy.     Left side of head: No submental or submandibular adenopathy.     Cervical: No cervical adenopathy.  Skin:    General: Skin is warm and dry.     Coloration: Skin is not pale.     Findings: Rash  present.     Nails: There is no clubbing.     Comments: Classic poison ivy rash over forehead  Neurological:     Mental Status: He is alert and oriented to person, place, and time.     Sensory: No sensory deficit.  Psychiatric:        Speech: Speech normal.        Behavior: Behavior normal.         Assessment & Plan:   Problem List Items Addressed This Visit       Respiratory   Moderate persistent asthma without complication - Primary    Symptom complex consistent with moderate persistent asthma.  Plan for this patient to receive albuterol as needed and will check IgE levels.  I want him to meet with legal aid to see if he can get out of his rent however he declined this service at this time      Relevant Medications   Albuterol Sulfate (PROAIR RESPICLICK) 108 (90 Base) MCG/ACT AEPB   Other Relevant Orders   IgE   DG Chest 2 View     Musculoskeletal and Integument   Skin rash    Related to poison ivy exposure will provide topical steroid        Other   Back pain    Likely musculoskeletal doubt intrapulmonary but will check chest x-ray in any case  Patient will use diclofenac gel on affected areas      Encounter for health-related screening    Complete health screening will occur      Relevant Orders   HCV Ab w Reflex to Quant PCR   Comprehensive metabolic panel   CBC  with Differential/Platelet   Lipid panel   HIV Antibody (routine testing w rflx)   Other Visit Diagnoses     Chest pain, unspecified type       Relevant Orders   DG Chest 2 View   Colon cancer screening       Relevant Orders   Ambulatory referral to Gastroenterology       Return in about 1 year (around 08/08/2023), or if symptoms worsen or fail to improve, for primary care follow up.   Shan Levans, MD

## 2022-08-08 NOTE — Assessment & Plan Note (Signed)
Complete health screening will occur

## 2022-08-08 NOTE — Patient Instructions (Signed)
See back exercises Take Temovate cream to the forehead for poison ivy and use diclofenac cream on the back for back pain Albuterol inhaler was given take 2 puffs every 4-6 hours as needed for shortness of breath Complete screening labs will be obtained Obtain a chest x-ray this is at [address] at [facility] radiology  Referral for colonoscopy was made Case management discussed legal aid with you Use your snap program food assistance program for your food access  Return to clinic 1 year, if any conditions are identified we will see you sooner or return as needed

## 2022-08-09 LAB — HCV AB W REFLEX TO QUANT PCR: HCV Ab: NONREACTIVE

## 2022-08-09 LAB — LIPID PANEL
HDL: 70 mg/dL (ref >39)
Triglycerides: 71 mg/dL (ref 0–149)
VLDL Cholesterol Cal: 14 mg/dL (ref 5–40)

## 2022-08-09 LAB — COMPREHENSIVE METABOLIC PANEL
Albumin: 4.4 g/dL (ref 4.1–5.1)
CO2: 24 mmol/L (ref 20–29)
Calcium: 9.1 mg/dL (ref 8.7–10.2)
Chloride: 106 mmol/L (ref 96–106)
Creatinine, Ser: 0.79 mg/dL (ref 0.76–1.27)
Sodium: 145 mmol/L — ABNORMAL HIGH (ref 134–144)
Total Protein: 6.9 g/dL (ref 6.0–8.5)
eGFR: 108 mL/min/{1.73_m2} (ref >59)

## 2022-08-09 LAB — CBC WITH DIFFERENTIAL/PLATELET
Monocytes Absolute: 0.6 10*3/uL (ref 0.1–0.9)
Neutrophils: 61 %
RDW: 12.8 % (ref 11.6–15.4)

## 2022-08-11 ENCOUNTER — Telehealth: Payer: Self-pay

## 2022-08-11 LAB — LIPID PANEL
Chol/HDL Ratio: 2.4 ratio (ref 0.0–5.0)
Cholesterol, Total: 169 mg/dL (ref 100–199)
LDL Chol Calc (NIH): 85 mg/dL (ref 0–99)

## 2022-08-11 LAB — CBC WITH DIFFERENTIAL/PLATELET
Basophils Absolute: 0.1 10*3/uL (ref 0.0–0.2)
Basos: 1 %
EOS (ABSOLUTE): 0.4 10*3/uL (ref 0.0–0.4)
Eos: 5 %
Hematocrit: 43.3 % (ref 37.5–51.0)
Hemoglobin: 14.3 g/dL (ref 13.0–17.7)
Immature Grans (Abs): 0 10*3/uL (ref 0.0–0.1)
Immature Granulocytes: 0 %
Lymphocytes Absolute: 2.1 10*3/uL (ref 0.7–3.1)
Lymphs: 26 %
MCH: 28.9 pg (ref 26.6–33.0)
MCHC: 33 g/dL (ref 31.5–35.7)
MCV: 88 fL (ref 79–97)
Monocytes: 7 %
Neutrophils Absolute: 5 10*3/uL (ref 1.4–7.0)
Platelets: 275 10*3/uL (ref 150–450)
RBC: 4.94 x10E6/uL (ref 4.14–5.80)
WBC: 8.2 10*3/uL (ref 3.4–10.8)

## 2022-08-11 LAB — COMPREHENSIVE METABOLIC PANEL
ALT: 16 IU/L (ref 0–44)
AST: 23 IU/L (ref 0–40)
Alkaline Phosphatase: 56 IU/L (ref 44–121)
BUN/Creatinine Ratio: 20 (ref 9–20)
BUN: 16 mg/dL (ref 6–24)
Bilirubin Total: <0.2 mg/dL (ref 0.0–1.2)
Globulin, Total: 2.5 g/dL (ref 1.5–4.5)
Glucose: 70 mg/dL (ref 70–99)
Potassium: 4.4 mmol/L (ref 3.5–5.2)

## 2022-08-11 LAB — HIV ANTIBODY (ROUTINE TESTING W REFLEX): HIV Screen 4th Generation wRfx: NONREACTIVE

## 2022-08-11 LAB — HCV INTERPRETATION

## 2022-08-11 LAB — IGE: IgE (Immunoglobulin E), Serum: 105 IU/mL (ref 6–495)

## 2022-08-11 NOTE — Telephone Encounter (Signed)
-----   Message from Storm Frisk, MD sent at 08/11/2022  6:52 AM EDT ----- Let pt know all labs normal  neg Hiv hep c normal cholesterol and kidney liver blood count

## 2022-08-11 NOTE — Progress Notes (Signed)
Let pt know all labs normal  neg Hiv hep c normal cholesterol and kidney liver blood count

## 2022-08-11 NOTE — Telephone Encounter (Signed)
Pt  wife ( who is on DPR) was called and is aware of results, DOB was confirmed.

## 2022-08-25 ENCOUNTER — Ambulatory Visit: Payer: Medicaid Other | Attending: Internal Medicine | Admitting: Internal Medicine

## 2022-08-25 ENCOUNTER — Encounter: Payer: Self-pay | Admitting: Internal Medicine

## 2022-08-25 VITALS — BP 116/73 | HR 74 | Wt 139.2 lb

## 2022-08-25 DIAGNOSIS — D492 Neoplasm of unspecified behavior of bone, soft tissue, and skin: Secondary | ICD-10-CM | POA: Diagnosis not present

## 2022-08-25 NOTE — Progress Notes (Signed)
Patient ID: Douglas Day, male    DOB: [birth year]  MRN: [id number]  CC: growth on bottom eye lid   Subjective: Douglas Day is a 52 y.o. male who presents for UC visit.  Patient's wife is with him. His concerns today include:   Patient presents complaining of noticing a growth on his right lower eyelid.  Just noticed it yesterday.  It is not painful but he can feel it when he moves his eyeball. Patient Active Problem List   Diagnosis Date Noted   Moderate persistent asthma without complication 08/08/2022   Encounter for health-related screening 08/08/2022   Skin rash 08/08/2022   Back pain 03/16/2012     Current Outpatient Medications on File Prior to Visit  Medication Sig Dispense Refill   clobetasol cream (TEMOVATE) 0.05 % Apply 1 Application topically 2 (two) times daily. To forehead 30 g 0   diclofenac Sodium (VOLTAREN) 1 % GEL Apply to back 4 times a day as needed for pain 100 g 0   Albuterol Sulfate (PROAIR RESPICLICK) 108 (90 Base) MCG/ACT AEPB Inhale 2 puffs into the lungs every 6 (six) hours as needed. 1 each 4   No current facility-administered medications on file prior to visit.    No Known Allergies  Social History   Socioeconomic History   Marital status: Single    Spouse name: Not on file   Number of children: Not on file   Years of education: Not on file   Highest education level: Not on file  Occupational History   Not on file  Tobacco Use   Smoking status: Never   Smokeless tobacco: Not on file  Vaping Use   Vaping status: Never Used  Substance and Sexual Activity   Alcohol use: Not Currently    Comment: socially   Drug use: No   Sexual activity: Yes  Other Topics Concern   Not on file  Social History Narrative   Not on file   Social Determinants of Health   Financial Resource Strain: Not on file  Food Insecurity: Not on file  Transportation Needs: Not on file  Physical Activity: Not on file  Stress: Not on file  Social  Connections: Not on file  Intimate Partner Violence: Not on file    Family History  Problem Relation Age of Onset   Healthy Father     No past surgical history on file.  ROS: Review of Systems Negative except as stated above  PHYSICAL EXAM: BP 116/73 (BP Location: Right Arm, Patient Position: Sitting, Cuff Size: Normal)   Pulse 74   Wt 139 lb 3.2 oz (63.1 kg)   SpO2 100%   BMI 22.30 kg/m   Physical Exam   General appearance - alert, well appearing, middle-age male and in no distress Eyes -patient noted to have tiny fleshlike appearing growth on the right lower eyelid lateral aspect.     Latest Ref Rng & Units 08/08/2022   10:21 AM  CMP  Glucose 70 - 99 mg/dL 70   BUN 6 - 24 mg/dL 16   Creatinine 8.29 - 1.27 mg/dL 5.62   Sodium 130 - 865 mmol/L 145   Potassium 3.5 - 5.2 mmol/L 4.4   Chloride 96 - 106 mmol/L 106   CO2 20 - 29 mmol/L 24   Calcium 8.7 - 10.2 mg/dL 9.1   Total Protein 6.0 - 8.5 g/dL 6.9   Total Bilirubin 0.0 - 1.2 mg/dL <7.8   Alkaline Phos 44 - 121 IU/L 56  AST 0 - 40 IU/L 23   ALT 0 - 44 IU/L 16    Lipid Panel     Component Value Date/Time   CHOL 169 08/08/2022 1021   TRIG 71 08/08/2022 1021   HDL 70 08/08/2022 1021   CHOLHDL 2.4 08/08/2022 1021   LDLCALC 85 08/08/2022 1021    CBC    Component Value Date/Time   WBC 8.2 08/08/2022 1021   RBC 4.94 08/08/2022 1021   HGB 14.3 08/08/2022 1021   HCT 43.3 08/08/2022 1021   PLT 275 08/08/2022 1021   MCV 88 08/08/2022 1021   MCH 28.9 08/08/2022 1021   MCHC 33.0 08/08/2022 1021   RDW 12.8 08/08/2022 1021   LYMPHSABS 2.1 08/08/2022 1021   EOSABS 0.4 08/08/2022 1021   BASOSABS 0.1 08/08/2022 1021    ASSESSMENT AND PLAN:  1. Growth of eyelid - Ambulatory referral to Ophthalmology   Patient was given the opportunity to ask questions.  Patient verbalized understanding of the plan and was able to repeat key elements of the plan.   This documentation was completed using Social research officer, government.  Any transcriptional errors are unintentional.  Orders Placed This Encounter  Procedures   Ambulatory referral to Ophthalmology     Requested Prescriptions    No prescriptions requested or ordered in this encounter    No follow-ups on file.  Jonah Blue, MD, FACP

## 2024-01-30 ENCOUNTER — Ambulatory Visit
Admission: EM | Admit: 2024-01-30 | Discharge: 2024-01-30 | Disposition: A | Discharge status: Home / Self Care | Source: Home / Self Care

## 2024-01-30 ENCOUNTER — Other Ambulatory Visit: Payer: Self-pay

## 2024-01-30 DIAGNOSIS — J029 Acute pharyngitis, unspecified: Secondary | ICD-10-CM

## 2024-01-30 DIAGNOSIS — R062 Wheezing: Secondary | ICD-10-CM

## 2024-01-30 DIAGNOSIS — J101 Influenza due to other identified influenza virus with other respiratory manifestations: Secondary | ICD-10-CM | POA: Diagnosis not present

## 2024-01-30 LAB — POC COVID19/FLU A&B COMBO
Covid Antigen, POC: NEGATIVE
Influenza A Antigen, POC: POSITIVE — AB
Influenza B Antigen, POC: NEGATIVE

## 2024-01-30 LAB — POCT RAPID STREP A (OFFICE): Rapid Strep A Screen: NEGATIVE

## 2024-01-30 MED ORDER — FLUTICASONE PROPIONATE 50 MCG/ACT NA SUSP: 2.0000 | Freq: Every day | NASAL | 0 refills | Status: AC

## 2024-01-30 MED ORDER — PREDNISONE 20 MG PO TABS: 40.0000 mg | ORAL_TABLET | Freq: Every day | ORAL | 0 refills | Status: AC

## 2024-01-30 MED ORDER — PROMETHAZINE-DM 6.25-15 MG/5ML PO SYRP: 10.0000 mL | ORAL_SOLUTION | Freq: Four times a day (QID) | ORAL | 0 refills | Status: AC | PRN

## 2024-01-30 MED ORDER — OSELTAMIVIR PHOSPHATE 75 MG PO CAPS: 75.0000 mg | ORAL_CAPSULE | Freq: Two times a day (BID) | ORAL | 0 refills | Status: AC

## 2024-01-30 MED ORDER — CETIRIZINE-PSEUDOEPHEDRINE ER 5-120 MG PO TB12: 1.0000 | ORAL_TABLET | Freq: Every day | ORAL | 0 refills | Status: AC

## 2024-01-30 MED ORDER — IPRATROPIUM-ALBUTEROL 0.5-2.5 (3) MG/3ML IN SOLN
3.0000 mL | Freq: Once | RESPIRATORY_TRACT | Status: AC
  Administered 2024-01-30: 12:00:00 3 mL via RESPIRATORY_TRACT

## 2024-01-30 NOTE — ED Provider Notes (Signed)
 [facility]    CSN: [id number] Arrival date & time: 01/30/24  1057      History   Chief Complaint Chief Complaint  Patient presents with   Sore Throat   Wheezing    HPI Douglas Day is a 53 y.o. male.   Discussed the use of AI scribe software for clinical note transcription with the patient, who gave verbal consent to proceed.   The patient presents with upper respiratory symptoms that began a couple of days ago. The patient initially developed a sore throat and headache, followed by body aches. The patient reports some nasal congestion with post-nasal drainage and a mild cough. There is no sneezing or rhinorrhea. The patient has been treating symptoms with lemon and honey. The patient denies nausea, vomiting, or diarrhea. The patient's wife recently sought medical care for similar symptoms.   The following sections of the patient's history were reviewed and updated as appropriate: allergies, current medications, past family history, past medical history, past social history, past surgical history, and problem list.     Past Medical History:  Diagnosis Date   Asthma    Back pain     Patient Active Problem List   Diagnosis Date Noted   Moderate persistent asthma without complication 08/08/2022   Encounter for health-related screening 08/08/2022   Skin rash 08/08/2022   Back pain 03/16/2012    History reviewed. No pertinent surgical history.     Home Medications    Prior to Admission medications  Medication Sig Start Date End Date Taking? Authorizing Provider  albuterol  (VENTOLIN  HFA) 108 (90 Base) MCG/ACT inhaler Inhale 2 puffs into the lungs every 4 (four) hours as needed for wheezing or shortness of breath (bronchospasms). 01/30/24  Yes Tien Spooner, FNP  cetirizine -pseudoephedrine  (ZYRTEC -D) 5-120 MG tablet Take 1 tablet by mouth daily with breakfast for 10 days. 01/30/24 02/09/24 Yes Iola Lukes, FNP  fluticasone  (FLONASE ) 50  MCG/ACT nasal spray Place 2 sprays into both nostrils daily. Shake well before use. Gently blow nose before spraying. Do not blow nose immediately after use. You should not taste the medication or feel it going down your throat; if you do, adjust your technique. 01/30/24  Yes Iola Lukes, FNP  oseltamivir  (TAMIFLU ) 75 MG capsule Take 1 capsule (75 mg total) by mouth every 12 (twelve) hours for 5 days. 01/30/24 02/04/24 Yes Maeghan Canny, FNP  predniSONE  (DELTASONE ) 20 MG tablet Take 2 tablets (40 mg total) by mouth daily for 5 days. 01/30/24 02/04/24 Yes Iola Lukes, FNP  promethazine -dextromethorphan (PROMETHAZINE -DM) 6.25-15 MG/5ML syrup Take 10 mLs by mouth every 6 (six) hours as needed for cough. 01/30/24  Yes Iola Lukes, FNP    Family History History reviewed. No pertinent family history.  Social History Social History[1]   Allergies   Patient has no known allergies.   Review of Systems Review of Systems  Constitutional:  Positive for fever.  HENT:  Positive for postnasal drip, rhinorrhea (a little bit), sneezing (sometimes) and sore throat. Negative for congestion.   Respiratory:  Positive for cough (a little bit). Negative for shortness of breath and wheezing.   Gastrointestinal:  Negative for diarrhea, nausea and vomiting.  Musculoskeletal:  Positive for myalgias.  Neurological:  Positive for headaches.  All other systems reviewed and are negative.    Physical Exam Triage Vital Signs ED Triage Vitals  Encounter Vitals Group     BP 01/30/24 1137 112/78     Girls Systolic BP Percentile --      Girls  Diastolic BP Percentile --      Boys Systolic BP Percentile --      Boys Diastolic BP Percentile --      Pulse Rate 01/30/24 1137 72     Resp 01/30/24 1137 17     Temp 01/30/24 1137 98.7 F (37.1 C)     Temp Source 01/30/24 1137 Oral     SpO2 01/30/24 1137 97 %     Weight --      Height --      Head Circumference --      Peak Flow --      Pain  Score 01/30/24 1135 8     Pain Loc --      Pain Education --      Exclude from Growth Chart --    No data found.  Updated Vital Signs BP 112/78 (BP Location: Right Arm)   Pulse 72   Temp 98.7 F (37.1 C) (Oral)   Resp 17   SpO2 97%   Visual Acuity Right Eye Distance:   Left Eye Distance:   Bilateral Distance:    Right Eye Near:   Left Eye Near:    Bilateral Near:     Physical Exam Vitals reviewed.  Constitutional:      General: He is awake. He is not in acute distress.    Appearance: Normal appearance. He is well-developed. He is not ill-appearing, toxic-appearing or diaphoretic.  HENT:     Head: Normocephalic.     Right Ear: Tympanic membrane, ear canal and external ear normal. No drainage, swelling or tenderness. No middle ear effusion. Tympanic membrane is not erythematous.     Left Ear: Tympanic membrane, ear canal and external ear normal. No drainage, swelling or tenderness.  No middle ear effusion. Tympanic membrane is not erythematous.     Nose: Congestion present.     Mouth/Throat:     Lips: Pink.     Mouth: Mucous membranes are moist.     Pharynx: No pharyngeal swelling, oropharyngeal exudate, posterior oropharyngeal erythema or uvula swelling.     Tonsils: No tonsillar exudate or tonsillar abscesses.  Eyes:     General: Vision grossly intact.     Conjunctiva/sclera: Conjunctivae normal.  Cardiovascular:     Rate and Rhythm: Normal rate.     Heart sounds: Normal heart sounds.  Pulmonary:     Effort: Pulmonary effort is normal. No tachypnea or respiratory distress.     Breath sounds: Normal air entry. No decreased air movement. Wheezing present. No decreased breath sounds.     Comments: Diffused wheezing noted throughout all lung fields. Respirations even and unlabored.  Musculoskeletal:        General: Normal range of motion.     Cervical back: Normal range of motion and neck supple.  Lymphadenopathy:     Cervical: No cervical adenopathy.  Skin:     General: Skin is warm and dry.  Neurological:     General: No focal deficit present.     Mental Status: He is alert and oriented to person, place, and time.  Psychiatric:        Behavior: Behavior is cooperative.      UC Treatments / Results  Labs (all labs ordered are listed, but only abnormal results are displayed) Labs Reviewed  POC COVID19/FLU A&B COMBO - Abnormal; Notable for the following components:      Result Value   Influenza A Antigen, POC Positive (*)    All other components within normal  limits  POCT RAPID STREP A (OFFICE) - Normal    EKG   Radiology No results found.  Procedures Procedures (including critical care time)  Medications Ordered in UC Medications  ipratropium-albuterol  (DUONEB) 0.5-2.5 (3) MG/3ML nebulizer solution 3 mL (3 mLs Nebulization Given 01/30/24 1208)    Initial Impression / Assessment and Plan / UC Course  I have reviewed the triage vital signs and the nursing notes.  Pertinent labs & imaging results that were available during my care of the patient were reviewed by me and considered in my medical decision making (see chart for details).     The patient presents with several days of upper respiratory symptoms beginning with sore throat and headache, followed by body aches, nasal congestion with post-nasal drainage, and a mild cough. He denies rhinorrhea, sneezing, nausea, vomiting, or diarrhea. He reports recent exposure, as his wife was evaluated for similar symptoms. On examination, the patient is afebrile, nontoxic, and in no acute distress. Respirations are even and unlabored. Diffuse wheezing is noted throughout all lung fields without decreased air movement or focal findings.  Point-of-care testing is negative for streptococcal pharyngitis, COVID-19, and influenza B, and positive for influenza A. Given the presence of wheezing, the patient was treated in clinic with a DuoNeb breathing treatment, which resulted in improvement in  lung sounds. The overall presentation is consistent with influenza A with associated reactive airway symptoms. The patient has no significant past medical history, takes no daily medications, and does not smoke.  Treatment includes initiation of oseltamivir  and continued supportive care. The patient was advised to follow up with his primary care provider if symptoms do not improve over the next several days or if wheezing or cough persists. He was instructed to seek emergency care for worsening shortness of breath, chest pain, persistent high fever, dizziness, inability to tolerate fluids, or any acute clinical deterioration.  Today's evaluation has revealed no signs of a dangerous process. Discussed diagnosis with patient and/or guardian. Patient and/or guardian aware of their diagnosis, possible red flag symptoms to watch out for and need for close follow up. Patient and/or guardian understands verbal and written discharge instructions. Patient and/or guardian comfortable with plan and disposition.  Patient and/or guardian has a clear mental status at this time, good insight into illness (after discussion and teaching) and has clear judgment to make decisions regarding their care  Documentation was completed with the aid of voice recognition software. Transcription may contain typographical errors.  Final Clinical Impressions(s) / UC Diagnoses   Final diagnoses:  Acute sore throat  Influenza A  Wheezing     Discharge Instructions      You were seen today for cold and flu-like symptoms, including sore throat, headache, body aches, congestion, and cough. Your testing shows that you are positive for influenza A. Your exam was otherwise reassuring, and although wheezing was heard in your lungs, your breathing was stable and improved after a breathing treatment given in the clinic.  Take medications as prescribed. Continue supportive care at home by getting plenty of rest, drinking fluids to stay  well hydrated, and using comfort measures such as lemon and honey for throat irritation. Humidifier, warm showers, and avoiding smoke or other airway irritants can also help with cough and congestion.  Seek emergency care right away if you have worsening shortness of breath, chest pain, persistent or high fever, dizziness or fainting, inability to keep fluids down, confusion, or any sudden or concerning change in your condition.  ED Prescriptions     Medication Sig Dispense Auth. Provider   predniSONE  (DELTASONE ) 20 MG tablet Take 2 tablets (40 mg total) by mouth daily for 5 days. 10 tablet Iola Lukes, FNP   albuterol  (VENTOLIN  HFA) 108 (90 Base) MCG/ACT inhaler Inhale 2 puffs into the lungs every 4 (four) hours as needed for wheezing or shortness of breath (bronchospasms). 18 g Iola Lukes, FNP   promethazine -dextromethorphan (PROMETHAZINE -DM) 6.25-15 MG/5ML syrup Take 10 mLs by mouth every 6 (six) hours as needed for cough. 118 mL Iola Lukes, FNP   fluticasone  (FLONASE ) 50 MCG/ACT nasal spray Place 2 sprays into both nostrils daily. Shake well before use. Gently blow nose before spraying. Do not blow nose immediately after use. You should not taste the medication or feel it going down your throat; if you do, adjust your technique. 16 g Iola Lukes, FNP   cetirizine -pseudoephedrine  (ZYRTEC -D) 5-120 MG tablet Take 1 tablet by mouth daily with breakfast for 10 days. 10 tablet Iola Lukes, FNP   oseltamivir  (TAMIFLU ) 75 MG capsule Take 1 capsule (75 mg total) by mouth every 12 (twelve) hours for 5 days. 10 capsule Iola Lukes, FNP      PDMP not reviewed this encounter.     [1]  Social History Tobacco Use   Smoking status: Never  Vaping Use   Vaping status: Never Used  Substance Use Topics   Alcohol use: Not Currently    Comment: socially   Drug use: No     Iola Lukes, FNP 01/30/24 1234

## 2024-01-30 NOTE — Discharge Instructions (Addendum)
 You were seen today for cold and flu-like symptoms, including sore throat, headache, body aches, congestion, and cough. Your testing shows that you are positive for influenza A. Your exam was otherwise reassuring, and although wheezing was heard in your lungs, your breathing was stable and improved after a breathing treatment given in the clinic.  Take medications as prescribed. Continue supportive care at home by getting plenty of rest, drinking fluids to stay well hydrated, and using comfort measures such as lemon and honey for throat irritation. Humidifier, warm showers, and avoiding smoke or other airway irritants can also help with cough and congestion.  Seek emergency care right away if you have worsening shortness of breath, chest pain, persistent or high fever, dizziness or fainting, inability to keep fluids down, confusion, or any sudden or concerning change in your condition.

## 2024-01-30 NOTE — ED Triage Notes (Signed)
 Pt presents with a chief complaint of sore throat x 1 week. This is accompanied with headaches. Does endorse fevers however none noted today. Currently rates overall throat pain an 8/10. States he has been taking OTC medicine for his throat. Unsure of the medication name. Hot showers have seemed to help symptoms per pt.
